# Patient Record
Sex: Female | Born: 1970 | Race: White | Hispanic: No | Marital: Single | Smoking: Former smoker
Health system: Southern US, Community
[De-identification: ages and names within clinical notes are randomized; demographics above are authoritative.]

## PROBLEM LIST (undated history)

## (undated) DIAGNOSIS — M419 Scoliosis, unspecified: Secondary | ICD-10-CM

## (undated) DIAGNOSIS — M810 Age-related osteoporosis without current pathological fracture: Secondary | ICD-10-CM

## (undated) DIAGNOSIS — N200 Calculus of kidney: Secondary | ICD-10-CM

## (undated) DIAGNOSIS — J45909 Unspecified asthma, uncomplicated: Secondary | ICD-10-CM

## (undated) DIAGNOSIS — G43909 Migraine, unspecified, not intractable, without status migrainosus: Secondary | ICD-10-CM

## (undated) DIAGNOSIS — F410 Panic disorder [episodic paroxysmal anxiety] without agoraphobia: Secondary | ICD-10-CM

## (undated) DIAGNOSIS — K219 Gastro-esophageal reflux disease without esophagitis: Secondary | ICD-10-CM

## (undated) DIAGNOSIS — E079 Disorder of thyroid, unspecified: Secondary | ICD-10-CM

## (undated) DIAGNOSIS — K5792 Diverticulitis of intestine, part unspecified, without perforation or abscess without bleeding: Secondary | ICD-10-CM

## (undated) DIAGNOSIS — G43109 Migraine with aura, not intractable, without status migrainosus: Secondary | ICD-10-CM

## (undated) DIAGNOSIS — R569 Unspecified convulsions: Secondary | ICD-10-CM

## (undated) DIAGNOSIS — N159 Renal tubulo-interstitial disease, unspecified: Secondary | ICD-10-CM

## (undated) DIAGNOSIS — J4520 Mild intermittent asthma, uncomplicated: Secondary | ICD-10-CM

## (undated) DIAGNOSIS — J302 Other seasonal allergic rhinitis: Secondary | ICD-10-CM

## (undated) DIAGNOSIS — E039 Hypothyroidism, unspecified: Secondary | ICD-10-CM

## (undated) HISTORY — PX: APPENDECTOMY: SHX54

## (undated) HISTORY — PX: CERVICAL DISC SURGERY: SHX588

## (undated) HISTORY — DX: Unspecified convulsions: R56.9

## (undated) HISTORY — DX: Age-related osteoporosis without current pathological fracture: M81.0

## (undated) HISTORY — DX: Panic disorder (episodic paroxysmal anxiety): F41.0

## (undated) HISTORY — DX: Migraine with aura, not intractable, without status migrainosus: G43.109

## (undated) HISTORY — PX: TONSILLECTOMY: SUR1361

## (undated) HISTORY — DX: Hypothyroidism, unspecified: E03.9

## (undated) HISTORY — DX: Scoliosis, unspecified: M41.9

## (undated) HISTORY — PX: THYROGLOSSAL DUCT CYST: SHX297

## (undated) HISTORY — PX: TUBAL LIGATION: SHX77

## (undated) HISTORY — PX: COLON SURGERY: SHX602

## (undated) HISTORY — PX: KNEE SURGERY: SHX244

---

## 2009-04-05 ENCOUNTER — Emergency Department: Payer: Self-pay | Admitting: Emergency Medicine

## 2009-04-06 ENCOUNTER — Emergency Department: Payer: Self-pay | Admitting: Emergency Medicine

## 2012-08-27 ENCOUNTER — Emergency Department: Payer: Self-pay | Admitting: Internal Medicine

## 2012-08-27 LAB — URINALYSIS, COMPLETE
Bilirubin,UR: NEGATIVE
Glucose,UR: NEGATIVE mg/dL (ref 0–75)
Ketone: NEGATIVE
Protein: NEGATIVE
RBC,UR: 2 /HPF (ref 0–5)
Specific Gravity: 1.008 (ref 1.003–1.030)
Squamous Epithelial: 7
WBC UR: 8 /HPF (ref 0–5)

## 2012-08-27 LAB — COMPREHENSIVE METABOLIC PANEL
Albumin: 3.8 g/dL (ref 3.4–5.0)
Anion Gap: 9 (ref 7–16)
Bilirubin,Total: 0.2 mg/dL (ref 0.2–1.0)
Calcium, Total: 8.8 mg/dL (ref 8.5–10.1)
Chloride: 109 mmol/L — ABNORMAL HIGH (ref 98–107)
EGFR (African American): >60
EGFR (Non-African Amer.): >60
Glucose: 113 mg/dL — ABNORMAL HIGH (ref 65–99)
SGOT(AST): 33 U/L (ref 15–37)
SGPT (ALT): 54 U/L (ref 12–78)
Sodium: 141 mmol/L (ref 136–145)
Total Protein: 7.4 g/dL (ref 6.4–8.2)

## 2012-08-27 LAB — CBC
Platelet: 292 10*3/uL (ref 150–440)
RBC: 4.56 10*6/uL (ref 3.80–5.20)
WBC: 9.8 10*3/uL (ref 3.6–11.0)

## 2012-08-29 LAB — URINE CULTURE

## 2013-01-24 ENCOUNTER — Emergency Department: Payer: Self-pay | Admitting: Emergency Medicine

## 2013-01-24 LAB — URINALYSIS, COMPLETE
Glucose,UR: NEGATIVE mg/dL (ref 0–75)
Ketone: NEGATIVE
Leukocyte Esterase: NEGATIVE
Nitrite: NEGATIVE
Protein: 30
RBC,UR: 19 /HPF (ref 0–5)
Specific Gravity: 1.025 (ref 1.003–1.030)
Squamous Epithelial: 4
WBC UR: 5 /HPF (ref 0–5)

## 2013-01-24 LAB — COMPREHENSIVE METABOLIC PANEL
Albumin: 4.6 g/dL (ref 3.4–5.0)
Alkaline Phosphatase: 81 U/L (ref 50–136)
Anion Gap: 8 (ref 7–16)
BUN: 9 mg/dL (ref 7–18)
Calcium, Total: 9.4 mg/dL (ref 8.5–10.1)
Chloride: 107 mmol/L (ref 98–107)
Co2: 26 mmol/L (ref 21–32)
EGFR (Non-African Amer.): >60
Glucose: 117 mg/dL — ABNORMAL HIGH (ref 65–99)
Potassium: 3.5 mmol/L (ref 3.5–5.1)
SGPT (ALT): 52 U/L (ref 12–78)

## 2013-01-24 LAB — CBC
HCT: 41.1 % (ref 35.0–47.0)
MCH: 29.9 pg (ref 26.0–34.0)
MCHC: 33.9 g/dL (ref 32.0–36.0)
MCV: 88 fL (ref 80–100)
Platelet: 356 10*3/uL (ref 150–440)
WBC: 12.8 10*3/uL — ABNORMAL HIGH (ref 3.6–11.0)

## 2013-12-23 ENCOUNTER — Emergency Department: Payer: Self-pay | Admitting: Emergency Medicine

## 2013-12-24 LAB — COMPREHENSIVE METABOLIC PANEL
ALBUMIN: 4.3 g/dL (ref 3.4–5.0)
ALK PHOS: 65 U/L
AST: 22 U/L (ref 15–37)
Anion Gap: 6 — ABNORMAL LOW (ref 7–16)
BUN: 11 mg/dL (ref 7–18)
Bilirubin,Total: 0.2 mg/dL (ref 0.2–1.0)
CHLORIDE: 106 mmol/L (ref 98–107)
Calcium, Total: 9.3 mg/dL (ref 8.5–10.1)
Co2: 29 mmol/L (ref 21–32)
Creatinine: 0.77 mg/dL (ref 0.60–1.30)
EGFR (Non-African Amer.): >60
Glucose: 91 mg/dL (ref 65–99)
Osmolality: 280 (ref 275–301)
POTASSIUM: 4 mmol/L (ref 3.5–5.1)
SGPT (ALT): 21 U/L (ref 12–78)
Sodium: 141 mmol/L (ref 136–145)
Total Protein: 8.1 g/dL (ref 6.4–8.2)

## 2013-12-24 LAB — CBC WITH DIFFERENTIAL/PLATELET
Basophil #: 0.1 10*3/uL (ref 0.0–0.1)
Basophil %: 0.5 %
EOS ABS: 0.1 10*3/uL (ref 0.0–0.7)
Eosinophil %: 1 %
HCT: 39.4 % (ref 35.0–47.0)
HGB: 12.8 g/dL (ref 12.0–16.0)
Lymphocyte #: 2 10*3/uL (ref 1.0–3.6)
Lymphocyte %: 17.2 %
MCH: 28.3 pg (ref 26.0–34.0)
MCHC: 32.4 g/dL (ref 32.0–36.0)
MCV: 87 fL (ref 80–100)
Monocyte #: 1 x10 3/mm — ABNORMAL HIGH (ref 0.2–0.9)
Monocyte %: 8.3 %
NEUTROS ABS: 8.6 10*3/uL — AB (ref 1.4–6.5)
Neutrophil %: 73 %
Platelet: 264 10*3/uL (ref 150–440)
RBC: 4.52 10*6/uL (ref 3.80–5.20)
RDW: 13.9 % (ref 11.5–14.5)
WBC: 11.8 10*3/uL — ABNORMAL HIGH (ref 3.6–11.0)

## 2013-12-24 LAB — URINALYSIS, COMPLETE
Bilirubin,UR: NEGATIVE
Glucose,UR: NEGATIVE mg/dL (ref 0–75)
KETONE: NEGATIVE
NITRITE: NEGATIVE
Ph: 7 (ref 4.5–8.0)
Protein: NEGATIVE
RBC,UR: 1 /HPF (ref 0–5)
Specific Gravity: 1.002 (ref 1.003–1.030)
WBC UR: 11 /HPF (ref 0–5)

## 2014-01-06 ENCOUNTER — Encounter (HOSPITAL_COMMUNITY): Payer: Self-pay | Admitting: Emergency Medicine

## 2014-01-06 ENCOUNTER — Emergency Department (HOSPITAL_COMMUNITY): Payer: Medicaid Other

## 2014-01-06 ENCOUNTER — Emergency Department (HOSPITAL_COMMUNITY)
Admission: EM | Admit: 2014-01-06 | Discharge: 2014-01-06 | Disposition: A | Discharge status: Home / Self Care | Payer: Medicaid Other | Attending: Emergency Medicine | Admitting: Emergency Medicine

## 2014-01-06 DIAGNOSIS — Z8719 Personal history of other diseases of the digestive system: Secondary | ICD-10-CM | POA: Insufficient documentation

## 2014-01-06 DIAGNOSIS — E079 Disorder of thyroid, unspecified: Secondary | ICD-10-CM | POA: Insufficient documentation

## 2014-01-06 DIAGNOSIS — Z8619 Personal history of other infectious and parasitic diseases: Secondary | ICD-10-CM | POA: Insufficient documentation

## 2014-01-06 DIAGNOSIS — M545 Low back pain, unspecified: Secondary | ICD-10-CM | POA: Insufficient documentation

## 2014-01-06 DIAGNOSIS — Z3202 Encounter for pregnancy test, result negative: Secondary | ICD-10-CM | POA: Insufficient documentation

## 2014-01-06 DIAGNOSIS — R109 Unspecified abdominal pain: Secondary | ICD-10-CM | POA: Insufficient documentation

## 2014-01-06 DIAGNOSIS — G43909 Migraine, unspecified, not intractable, without status migrainosus: Secondary | ICD-10-CM | POA: Insufficient documentation

## 2014-01-06 DIAGNOSIS — Z79899 Other long term (current) drug therapy: Secondary | ICD-10-CM | POA: Insufficient documentation

## 2014-01-06 DIAGNOSIS — Z87442 Personal history of urinary calculi: Secondary | ICD-10-CM | POA: Insufficient documentation

## 2014-01-06 DIAGNOSIS — J45909 Unspecified asthma, uncomplicated: Secondary | ICD-10-CM | POA: Insufficient documentation

## 2014-01-06 HISTORY — DX: Unspecified asthma, uncomplicated: J45.909

## 2014-01-06 HISTORY — DX: Disorder of thyroid, unspecified: E07.9

## 2014-01-06 HISTORY — DX: Diverticulitis of intestine, part unspecified, without perforation or abscess without bleeding: K57.92

## 2014-01-06 HISTORY — DX: Calculus of kidney: N20.0

## 2014-01-06 HISTORY — DX: Renal tubulo-interstitial disease, unspecified: N15.9

## 2014-01-06 HISTORY — DX: Migraine, unspecified, not intractable, without status migrainosus: G43.909

## 2014-01-06 LAB — URINALYSIS, ROUTINE W REFLEX MICROSCOPIC
Bilirubin Urine: NEGATIVE
GLUCOSE, UA: NEGATIVE mg/dL
Ketones, ur: NEGATIVE mg/dL
Nitrite: NEGATIVE
Protein, ur: NEGATIVE mg/dL
SPECIFIC GRAVITY, URINE: 1.006 (ref 1.005–1.030)
UROBILINOGEN UA: 0.2 mg/dL (ref 0.0–1.0)
pH: 6 (ref 5.0–8.0)

## 2014-01-06 LAB — CBC WITH DIFFERENTIAL/PLATELET
BASOS ABS: 0 10*3/uL (ref 0.0–0.1)
Basophils Relative: 0 % (ref 0–1)
EOS ABS: 0.1 10*3/uL (ref 0.0–0.7)
EOS PCT: 1 % (ref 0–5)
HCT: 38.3 % (ref 36.0–46.0)
Hemoglobin: 12.9 g/dL (ref 12.0–15.0)
Lymphocytes Relative: 30 % (ref 12–46)
Lymphs Abs: 1.8 10*3/uL (ref 0.7–4.0)
MCH: 29.3 pg (ref 26.0–34.0)
MCHC: 33.7 g/dL (ref 30.0–36.0)
MCV: 86.8 fL (ref 78.0–100.0)
Monocytes Absolute: 0.7 10*3/uL (ref 0.1–1.0)
Monocytes Relative: 11 % (ref 3–12)
Neutro Abs: 3.5 10*3/uL (ref 1.7–7.7)
Neutrophils Relative %: 58 % (ref 43–77)
Platelets: 265 10*3/uL (ref 150–400)
RBC: 4.41 MIL/uL (ref 3.87–5.11)
RDW: 13.5 % (ref 11.5–15.5)
WBC: 6 10*3/uL (ref 4.0–10.5)

## 2014-01-06 LAB — COMPREHENSIVE METABOLIC PANEL
ALBUMIN: 4.3 g/dL (ref 3.5–5.2)
ALT: 16 U/L (ref 0–35)
AST: 20 U/L (ref 0–37)
Alkaline Phosphatase: 50 U/L (ref 39–117)
BUN: 9 mg/dL (ref 6–23)
CALCIUM: 9.6 mg/dL (ref 8.4–10.5)
CO2: 26 mEq/L (ref 19–32)
CREATININE: 0.72 mg/dL (ref 0.50–1.10)
Chloride: 101 mEq/L (ref 96–112)
GFR calc Af Amer: >90 mL/min (ref >90)
Glucose, Bld: 72 mg/dL (ref 70–99)
Potassium: 3.7 mEq/L (ref 3.7–5.3)
Sodium: 139 mEq/L (ref 137–147)
Total Bilirubin: <0.2 mg/dL — ABNORMAL LOW (ref 0.3–1.2)
Total Protein: 7.4 g/dL (ref 6.0–8.3)

## 2014-01-06 LAB — URINE MICROSCOPIC-ADD ON

## 2014-01-06 LAB — PREGNANCY, URINE: Preg Test, Ur: NEGATIVE

## 2014-01-06 MED ORDER — NAPROXEN 500 MG PO TABS: 500.0000 mg | ORAL_TABLET | Freq: Two times a day (BID) | ORAL | Status: DC

## 2014-01-06 MED ORDER — CYCLOBENZAPRINE HCL 10 MG PO TABS: 10.0000 mg | ORAL_TABLET | Freq: Two times a day (BID) | ORAL | Status: DC | PRN

## 2014-01-06 MED ORDER — KETOROLAC TROMETHAMINE 30 MG/ML IJ SOLN
30.0000 mg | Freq: Once | INTRAMUSCULAR | Status: AC
  Administered 2014-01-06: 30 mg via INTRAVENOUS
  Filled 2014-01-06: qty 1

## 2014-01-06 MED ORDER — CEPHALEXIN 250 MG PO CAPS
250.0000 mg | ORAL_CAPSULE | Freq: Four times a day (QID) | ORAL | Status: DC
Start: 2014-01-06 — End: 2015-05-23

## 2014-01-06 NOTE — Discharge Instructions (Signed)
Your CT scan showed small kidney stones inside your kidneys. Also there is evidence of a urinary tract infection. Please take keflex as prescribed until all gone for the infection. Take naprosyn for pain. Flexeril for muscle spasms. Follow up with your doctor.   Flank Pain Flank pain refers to pain that is located on the side of the body between the upper abdomen and the back. The pain may occur over a short period of time (acute) or may be long-term or reoccurring (chronic). It may be mild or severe. Flank pain can be caused by many things. CAUSES  Some of the more common causes of flank pain include:  Muscle strains.   Muscle spasms.   A disease of your spine (vertebral disk disease).   A lung infection (pneumonia).   Fluid around your lungs (pulmonary edema).   A kidney infection.   Kidney stones.   A very painful skin rash caused by the chickenpox virus (shingles).   Gallbladder disease.  HOME CARE INSTRUCTIONS  Home care will depend on the cause of your pain. In general,  Rest as directed by your caregiver.  Drink enough fluids to keep your urine clear or pale yellow.  Only take over-the-counter or prescription medicines as directed by your caregiver. Some medicines may help relieve the pain.  Tell your caregiver about any changes in your pain.  Follow up with your caregiver as directed. SEEK IMMEDIATE MEDICAL CARE IF:   Your pain is not controlled with medicine.   You have new or worsening symptoms.  Your pain increases.   You have abdominal pain.   You have shortness of breath.   You have persistent nausea or vomiting.   You have swelling in your abdomen.   You feel faint or pass out.   You have blood in your urine.  You have a fever or persistent symptoms for more than 2 3 days.  You have a fever and your symptoms suddenly get worse. MAKE SURE YOU:   Understand these instructions.  Will watch your condition.  Will get help right  away if you are not doing well or get worse. Document Released: 10/24/2005 Document Revised: 05/27/2012 Document Reviewed: 04/16/2012 [facility] Patient Information 2014 [place], [place].

## 2014-01-06 NOTE — ED Provider Notes (Signed)
CSN: [id number]     Arrival date & time 01/06/14  1427 History   First MD Initiated Contact with Patient 01/06/14 1616     Chief Complaint  Patient presents with  . Nephrolithiasis    stated that ultrasound today should a kidney stone     (Consider location/radiation/quality/duration/timing/severity/associated sxs/prior Treatment) HPI Cassandra Zuniga is a 43 y.o. female who presents to emergency department complaining of right flank pain for 2 weeks. Patient states she was initially seen at [facility] for this pain, was diagnosed with a kidney infection, was given a course of antibiotics which she has finished. She states her pain continued, so she went to a [facility] and was diagnosed with a 10 mm stone in the left kidney by ultrasound. She states she has not had any pain in the left flank. States her pain is mainly in the right mid back and wraps around it radiates into the right groin. She denies any urinary symptoms. She denies any vaginal discharge or bleeding. She does not believe she's pregnant. She admits to nausea and vomiting. No changes in bowels. States movement does not make her pain worse. States she is most uncomfortable at nighttime.   Past Medical History  Diagnosis Date  . Kidney stones   . Kidney infection   . Thyroid disease   . Diverticulitis   . Asthma   . Migraine    Past Surgical History  Procedure Laterality Date  . Tonsillectomy    . Thyroglossal duct cyst    . Cervical disc surgery    . Appendectomy    . Tubal ligation    . Knee surgery      bilateral   Family History  Problem Relation Age of Onset  . Diabetes Other   . Cancer Other    History  Substance Use Topics  . Smoking status: Never Smoker   . Smokeless tobacco: Not on file  . Alcohol Use: No   OB History   Grav Para Term Preterm Abortions TAB SAB Ect Mult Living                 Review of Systems  Constitutional: Negative for fever and chills.  Respiratory:  Negative for cough, chest tightness and shortness of breath.   Cardiovascular: Negative for chest pain, palpitations and leg swelling.  Gastrointestinal: Negative for nausea, vomiting, abdominal pain and diarrhea.  Genitourinary: Positive for flank pain. Negative for dysuria, frequency, vaginal bleeding, vaginal discharge, difficulty urinating, vaginal pain and pelvic pain.  Musculoskeletal: Positive for back pain. Negative for arthralgias, myalgias, neck pain and neck stiffness.  Skin: Negative for rash.  Neurological: Negative for dizziness, weakness, numbness and headaches.  All other systems reviewed and are negative.     Allergies  Erythromycin; Sulfa antibiotics; and Phenergan  Home Medications   Prior to Admission medications   Medication Sig Start Date End Date Taking? Authorizing Provider  butalbital-acetaminophen-caffeine (FIORICET, ESGIC) 50-325-40 MG per tablet Take 1-2 tablets by mouth every 4 (four) hours as needed for headache.   Yes Historical Provider, MD  famotidine (PEPCID) 20 MG tablet Take 20 mg by mouth 2 (two) times daily.   Yes Historical Provider, MD  ibuprofen (ADVIL,MOTRIN) 100 MG tablet Take 100 mg by mouth every 6 (six) hours as needed for pain or fever.   Yes Historical Provider, MD  levothyroxine (SYNTHROID, LEVOTHROID) 100 MCG tablet Take 100 mcg by mouth daily before breakfast.   Yes Historical Provider, MD  Multiple Vitamin (MULTIVITAMIN  WITH MINERALS) TABS tablet Take 1 tablet by mouth daily.   Yes Historical Provider, MD  oxyCODONE-acetaminophen (PERCOCET/ROXICET) 5-325 MG per tablet Take 1 tablet by mouth every 4 (four) hours as needed for severe pain.   Yes Historical Provider, MD  rizatriptan (MAXALT-MLT) 10 MG disintegrating tablet Take 10 mg by mouth as needed for migraine. May repeat in 2 hours if needed   Yes Historical Provider, MD   BP 110/57  Pulse 77  Temp(Src) 98 F (36.7 C) (Oral)  Resp 18  Wt 134 lb (60.782 kg)  SpO2 98%  LMP  12/20/2013 Physical Exam  Nursing note and vitals reviewed. Constitutional: She is oriented to person, place, and time. She appears well-developed and well-nourished. No distress.  HENT:  Head: Normocephalic.  Eyes: Conjunctivae are normal.  Neck: Neck supple.  Cardiovascular: Normal rate, regular rhythm and normal heart sounds.   Pulmonary/Chest: Effort normal and breath sounds normal. No respiratory distress. She has no wheezes. She has no rales.  Abdominal: Soft. Bowel sounds are normal. She exhibits no distension. There is no tenderness. There is no rebound.  Right CVA tenderness.  Musculoskeletal: She exhibits no edema.  Tender to palpation in right lower back. No midline tenderness.  Pain with right straight leg raise  Neurological: She is alert and oriented to person, place, and time.  5/5 and equal lower extremity strength. 2+ and equal patellar reflexes bilaterally. Pt able to dorsiflex bilateral toes and feet with good strength against resistance. Equal sensation bilaterally over thighs and lower legs.   Skin: Skin is warm and dry.  Psychiatric: She has a normal mood and affect. Her behavior is normal.    ED Course  Procedures (including critical care time) Labs Review Labs Reviewed  COMPREHENSIVE METABOLIC PANEL - Abnormal; Notable for the following:    Total Bilirubin <0.2 (*)    All other components within normal limits  URINALYSIS, ROUTINE W REFLEX MICROSCOPIC - Abnormal; Notable for the following:    Hgb urine dipstick TRACE (*)    Leukocytes, UA MODERATE (*)    All other components within normal limits  CBC WITH DIFFERENTIAL  URINE MICROSCOPIC-ADD ON  PREGNANCY, URINE    Imaging Review Ct Abdomen Pelvis Wo Contrast  01/06/2014   CLINICAL DATA:  Right flank pain for 2 weeks.  EXAM: CT ABDOMEN AND PELVIS WITHOUT CONTRAST  TECHNIQUE: Multidetector CT imaging of the abdomen and pelvis was performed following the standard protocol without intravenous contrast.   COMPARISON:  None.  FINDINGS: 0.8 x 0.7 cm subtle hypodense lesion in segment 4 of the liver, image 18 series 2.  Spleen, pancreas, and adrenal glands unremarkable. No specific gallbladder or biliary abnormality identified.  There are several 1-2 mm nonobstructive renal calculi in the right kidney lower pole. 3 mm left mid kidney calculus. No overt hydronephrosis. No definite ureteral calculus.  Appendix surgically absent.  Uterine and adnexal contours unremarkable. No free pelvic fluid. No dilated bowel noted. Urinary bladder unremarkable.  IMPRESSION: 1. Bilateral nonobstructive nephrolithiasis. 2. 8 mm hypodense lesion in segment 4 of the liver, technically nonspecific although statistically likely to be benign.   Electronically Signed   By: Herbie BaltimoreWalt  Liebkemann M.D.   On: 01/06/2014 17:00     EKG Interpretation None      MDM   Final diagnoses:  Flank pain    Patient with right flank pain, history of kidney stones. Recently treated with Cipro for a kidney infection. Pain is reproducible with palpation and movement. Neurovascularly intact. Will  check urine, labs, CT abdomen pelvis.   5:34 PM Toradol given for pain, states feels better. CT showing bilateral nonobstructive stones. Incidental finding of a lesion in the liver, discussed with patient, we'll need close followup. Urine does show moderate leukocytes with 11-20 white blood cells. Will start on Keflex. I do think her pain may be musculoskeletal. Will continue naprosyn, she has percocet at home, will add keflex. Follow up with PCP. Pt agreed to the plan. She is afebrile, no signs of cauda equina, no evidence of acute abdomen or sepsis. Stable for d/c home.   Filed Vitals:   01/06/14 1514  BP: 110/57  Pulse: 77  Temp: 98 F (36.7 C)  TempSrc: Oral  Resp: 18  Weight: 134 lb (60.782 kg)  SpO2: 98%       Giani Betzold A Marvyn Torrez, PA-C 01/06/14 1736

## 2014-01-06 NOTE — ED Notes (Addendum)
Pt reports a that an ultrasound was performed by PCP today. "[facility] noted a 10 mm stone was in the l/kidney." Pt c/o severe r/flank pain for 2 weeks, she completed medication for UTI and kidney infection. Hx of kidney stone 2 years ago.  Denies vomiting, occasional nausea

## 2014-01-07 NOTE — ED Provider Notes (Signed)
Medical screening examination/treatment/procedure(s) were performed by non-physician practitioner and as supervising physician I was immediately available for consultation/collaboration.   EKG Interpretation None        Suzi RootsKevin E Ivey Nembhard, MD 01/07/14 1158

## 2014-04-23 ENCOUNTER — Encounter (HOSPITAL_COMMUNITY): Payer: Self-pay | Admitting: Emergency Medicine

## 2014-04-23 ENCOUNTER — Emergency Department (HOSPITAL_COMMUNITY): Payer: Medicaid Other

## 2014-04-23 ENCOUNTER — Observation Stay (HOSPITAL_COMMUNITY)
Admission: EM | Admit: 2014-04-23 | Discharge: 2014-04-24 | Disposition: A | Discharge status: Home / Self Care | Payer: Medicaid Other | Attending: Family Medicine | Admitting: Family Medicine

## 2014-04-23 DIAGNOSIS — Z79899 Other long term (current) drug therapy: Secondary | ICD-10-CM | POA: Diagnosis not present

## 2014-04-23 DIAGNOSIS — E039 Hypothyroidism, unspecified: Secondary | ICD-10-CM | POA: Diagnosis present

## 2014-04-23 DIAGNOSIS — I498 Other specified cardiac arrhythmias: Secondary | ICD-10-CM | POA: Diagnosis not present

## 2014-04-23 DIAGNOSIS — R001 Bradycardia, unspecified: Secondary | ICD-10-CM | POA: Diagnosis present

## 2014-04-23 DIAGNOSIS — R079 Chest pain, unspecified: Secondary | ICD-10-CM | POA: Diagnosis present

## 2014-04-23 DIAGNOSIS — Z87891 Personal history of nicotine dependence: Secondary | ICD-10-CM | POA: Insufficient documentation

## 2014-04-23 DIAGNOSIS — K219 Gastro-esophageal reflux disease without esophagitis: Secondary | ICD-10-CM | POA: Diagnosis not present

## 2014-04-23 DIAGNOSIS — J4520 Mild intermittent asthma, uncomplicated: Secondary | ICD-10-CM | POA: Diagnosis present

## 2014-04-23 DIAGNOSIS — R0789 Other chest pain: Principal | ICD-10-CM | POA: Insufficient documentation

## 2014-04-23 DIAGNOSIS — G43909 Migraine, unspecified, not intractable, without status migrainosus: Secondary | ICD-10-CM | POA: Insufficient documentation

## 2014-04-23 DIAGNOSIS — J45909 Unspecified asthma, uncomplicated: Secondary | ICD-10-CM | POA: Insufficient documentation

## 2014-04-23 HISTORY — DX: Gastro-esophageal reflux disease without esophagitis: K21.9

## 2014-04-23 HISTORY — DX: Mild intermittent asthma, uncomplicated: J45.20

## 2014-04-23 HISTORY — DX: Other seasonal allergic rhinitis: J30.2

## 2014-04-23 LAB — CBC
HCT: 43.1 % (ref 36.0–46.0)
HEMOGLOBIN: 14.3 g/dL (ref 12.0–15.0)
MCH: 29.1 pg (ref 26.0–34.0)
MCHC: 33.2 g/dL (ref 30.0–36.0)
MCV: 87.6 fL (ref 78.0–100.0)
PLATELETS: 233 10*3/uL (ref 150–400)
RBC: 4.92 MIL/uL (ref 3.87–5.11)
RDW: 13.1 % (ref 11.5–15.5)
WBC: 8 10*3/uL (ref 4.0–10.5)

## 2014-04-23 LAB — BASIC METABOLIC PANEL
Anion gap: 16 — ABNORMAL HIGH (ref 5–15)
BUN: 12 mg/dL (ref 6–23)
CALCIUM: 9.7 mg/dL (ref 8.4–10.5)
CO2: 19 mEq/L (ref 19–32)
Chloride: 104 mEq/L (ref 96–112)
Creatinine, Ser: 0.64 mg/dL (ref 0.50–1.10)
GFR calc Af Amer: >90 mL/min (ref >90)
GLUCOSE: 83 mg/dL (ref 70–99)
Potassium: 5.1 mEq/L (ref 3.7–5.3)
SODIUM: 139 meq/L (ref 137–147)

## 2014-04-23 LAB — I-STAT TROPONIN, ED: TROPONIN I, POC: 0 ng/mL (ref 0.00–0.08)

## 2014-04-23 LAB — TROPONIN I: Troponin I: <0.3 ng/mL (ref <0.30)

## 2014-04-23 MED ORDER — DIPHENHYDRAMINE HCL 50 MG/ML IJ SOLN: 25.0000 mg | Freq: Once | INTRAMUSCULAR | Status: DC

## 2014-04-23 MED ORDER — ALBUTEROL SULFATE (2.5 MG/3ML) 0.083% IN NEBU: 2.5000 mg | INHALATION_SOLUTION | Freq: Four times a day (QID) | RESPIRATORY_TRACT | Status: DC | PRN

## 2014-04-23 MED ORDER — FAMOTIDINE 20 MG PO TABS
20.0000 mg | ORAL_TABLET | Freq: Two times a day (BID) | ORAL | Status: DC
  Administered 2014-04-23 – 2014-04-24 (×2): 20 mg via ORAL
  Filled 2014-04-23 (×3): qty 1

## 2014-04-23 MED ORDER — SODIUM CHLORIDE 0.9 % IJ SOLN
3.0000 mL | Freq: Two times a day (BID) | INTRAMUSCULAR | Status: DC
  Administered 2014-04-23 – 2014-04-24 (×2): 3 mL via INTRAVENOUS

## 2014-04-23 MED ORDER — DIPHENHYDRAMINE HCL 50 MG/ML IJ SOLN: 12.5000 mg | Freq: Four times a day (QID) | INTRAMUSCULAR | Status: DC | PRN

## 2014-04-23 MED ORDER — RIZATRIPTAN BENZOATE 10 MG PO TBDP: 10.0000 mg | ORAL_TABLET | ORAL | Status: DC | PRN

## 2014-04-23 MED ORDER — MORPHINE SULFATE 2 MG/ML IJ SOLN
1.0000 mg | INTRAMUSCULAR | Status: DC | PRN
  Administered 2014-04-24: 1 mg via INTRAVENOUS
  Filled 2014-04-23: qty 1

## 2014-04-23 MED ORDER — MORPHINE SULFATE 4 MG/ML IJ SOLN
4.0000 mg | Freq: Once | INTRAMUSCULAR | Status: AC
  Administered 2014-04-23: 4 mg via INTRAVENOUS
  Filled 2014-04-23: qty 1

## 2014-04-23 MED ORDER — ALBUTEROL SULFATE HFA 108 (90 BASE) MCG/ACT IN AERS: 2.0000 | INHALATION_SPRAY | Freq: Four times a day (QID) | RESPIRATORY_TRACT | Status: DC | PRN

## 2014-04-23 MED ORDER — ENOXAPARIN SODIUM 40 MG/0.4ML ~~LOC~~ SOLN
40.0000 mg | SUBCUTANEOUS | Status: DC
  Administered 2014-04-23: 40 mg via SUBCUTANEOUS
  Filled 2014-04-23 (×2): qty 0.4

## 2014-04-23 MED ORDER — SODIUM CHLORIDE 0.9 % IV SOLN: 250.0000 mL | INTRAVENOUS | Status: DC | PRN

## 2014-04-23 MED ORDER — FLUTICASONE PROPIONATE 50 MCG/ACT NA SUSP
1.0000 | Freq: Every day | NASAL | Status: DC
  Administered 2014-04-23 – 2014-04-24 (×2): 1 via NASAL
  Filled 2014-04-23: qty 16

## 2014-04-23 MED ORDER — ONDANSETRON HCL 4 MG/2ML IJ SOLN
4.0000 mg | Freq: Once | INTRAMUSCULAR | Status: AC
  Administered 2014-04-23: 4 mg via INTRAVENOUS
  Filled 2014-04-23: qty 2

## 2014-04-23 MED ORDER — DIPHENHYDRAMINE HCL 25 MG PO CAPS
25.0000 mg | ORAL_CAPSULE | Freq: Four times a day (QID) | ORAL | Status: DC | PRN
  Administered 2014-04-24: 25 mg via ORAL
  Filled 2014-04-23 (×2): qty 1

## 2014-04-23 MED ORDER — LEVOTHYROXINE SODIUM 100 MCG PO TABS
100.0000 ug | ORAL_TABLET | Freq: Every day | ORAL | Status: DC
  Administered 2014-04-24: 100 ug via ORAL
  Filled 2014-04-23 (×2): qty 1

## 2014-04-23 MED ORDER — SODIUM CHLORIDE 0.9 % IJ SOLN: 3.0000 mL | INTRAMUSCULAR | Status: DC | PRN

## 2014-04-23 MED ORDER — GI COCKTAIL ~~LOC~~
30.0000 mL | Freq: Once | ORAL | Status: AC
  Administered 2014-04-23: 30 mL via ORAL
  Filled 2014-04-23: qty 30

## 2014-04-23 MED ORDER — ACETAMINOPHEN 650 MG RE SUPP: 650.0000 mg | Freq: Four times a day (QID) | RECTAL | Status: DC | PRN

## 2014-04-23 MED ORDER — ASPIRIN EC 81 MG PO TBEC
81.0000 mg | DELAYED_RELEASE_TABLET | Freq: Every day | ORAL | Status: DC
  Administered 2014-04-23 – 2014-04-24 (×2): 81 mg via ORAL
  Filled 2014-04-23 (×2): qty 1

## 2014-04-23 MED ORDER — SODIUM CHLORIDE 0.9 % IJ SOLN: 3.0000 mL | Freq: Two times a day (BID) | INTRAMUSCULAR | Status: DC

## 2014-04-23 MED ORDER — NITROGLYCERIN 0.4 MG SL SUBL: 0.4000 mg | SUBLINGUAL_TABLET | SUBLINGUAL | Status: DC | PRN

## 2014-04-23 MED ORDER — ACETAMINOPHEN 325 MG PO TABS: 650.0000 mg | ORAL_TABLET | Freq: Four times a day (QID) | ORAL | Status: DC | PRN

## 2014-04-23 MED ORDER — BUTALBITAL-APAP-CAFFEINE 50-325-40 MG PO TABS: 1.0000 | ORAL_TABLET | Freq: Two times a day (BID) | ORAL | Status: DC | PRN

## 2014-04-23 MED ORDER — PROMETHAZINE HCL 25 MG PO TABS: 12.5000 mg | ORAL_TABLET | Freq: Four times a day (QID) | ORAL | Status: DC | PRN

## 2014-04-23 NOTE — ED Provider Notes (Signed)
CSN: [id number]     Arrival date & time 04/23/14  1214 History   First MD Initiated Contact with Patient 04/23/14 1220     Chief Complaint  Patient presents with  . Chest Pain     (Consider location/radiation/quality/duration/timing/severity/associated sxs/prior Treatment) HPI   The patient presents to the emergency department with complaints of chest pain and pressure. She reports waking up feeling fine and while she was cooking breakfast she developed some chest pressure. Then when eating breakfast it became more severe. While out working in the garden the pressure worsened and turned into pain and she started to have some numbness down her bilateral arms. She got up to go sit under a tree to rest and felt as though she might have syncopal episode and felt dizzy. It is at this time she decided to come into the emergency department for further evaluation. Since being here the pain continues, she is anxious and tearful. She has a past medical history positive for thyroid disease, migraines, acid reflux.  She does not have hypertension, hyperlipidemia or significant family history of cardiac disease. Non smoker. In triage her vital signs are stable and her EKG is unremarkable.   Past Medical History  Diagnosis Date  . Thyroid disease    Past Surgical History  Procedure Laterality Date  . Colon surgery    . Appendectomy     No family history on file. History  Substance Use Topics  . Smoking status: Never Smoker   . Smokeless tobacco: Not on file  . Alcohol Use: No   OB History   Grav Para Term Preterm Abortions TAB SAB Ect Mult Living                 Review of Systems   Review of Systems  Gen: no weight loss, fevers, chills, night sweats  Eyes: no discharge or drainage, no occular pain or visual changes  Nose: no epistaxis or rhinorrhea  Mouth: no dental pain, no sore throat  Neck: no neck pain  Lungs:No wheezing or hemoptysis No coughing CV:  No palpitations, dependent  edema or orthopnea. + chest pain  And pressure Abd: no diarrhea. No nausea or vomiting, No abdominal pain  GU: no dysuria or gross hematuria  MSK:  No muscle weakness, No  pain Neuro: no headache, no focal neurologic deficits  Skin: no rash , no wounds Psyche: no complaints    Allergies  Erythromycin; Percocet; Phenergan; and Sulfa antibiotics  Home Medications   Prior to Admission medications   Medication Sig Start Date End Date Taking? Authorizing Provider  albuterol (PROVENTIL HFA;VENTOLIN HFA) 108 (90 BASE) MCG/ACT inhaler Inhale 2 puffs into the lungs every 6 (six) hours as needed for wheezing or shortness of breath.   Yes Historical Provider, MD  butalbital-acetaminophen-caffeine (FIORICET, ESGIC) 50-325-40 MG per tablet Take 1 tablet by mouth 2 (two) times daily as needed for headache or migraine.   Yes Historical Provider, MD  famotidine (PEPCID) 20 MG tablet Take 20 mg by mouth 2 (two) times daily.   Yes Historical Provider, MD  fluticasone (FLONASE) 50 MCG/ACT nasal spray Place 1 spray into both nostrils daily.   Yes Historical Provider, MD  levothyroxine (SYNTHROID, LEVOTHROID) 100 MCG tablet Take 100 mcg by mouth daily before breakfast.   Yes Historical Provider, MD  rizatriptan (MAXALT-MLT) 10 MG disintegrating tablet Take 10 mg by mouth every 2 (two) hours as needed for migraine. May repeat in 2 hours if needed   Yes Historical Provider,  MD  promethazine (PHENERGAN) 12.5 MG tablet Take 12.5 mg by mouth every 6 (six) hours as needed for nausea or vomiting.    Historical Provider, MD   BP 128/90  Pulse 67  Resp 16  SpO2 99%  LMP 04/11/2014 Physical Exam  Nursing note and vitals reviewed. Constitutional: She appears well-developed and well-nourished. No distress.  HENT:  Head: Normocephalic and atraumatic.  Eyes: Pupils are equal, round, and reactive to light.  Neck: Normal range of motion. Neck supple.  Cardiovascular: Normal rate and regular rhythm.    Pulmonary/Chest: Effort normal and breath sounds normal. She exhibits no tenderness, no laceration and no retraction.  Abdominal: Soft.  Neurological: She is alert.  Skin: Skin is warm and dry.  Psychiatric: Her mood appears not anxious.     ED Course  Procedures (including critical care time) Labs Review Labs Reviewed  BASIC METABOLIC PANEL - Abnormal; Notable for the following:    Anion gap 16 (*)    All other components within normal limits  CBC  I-STAT TROPOININ, ED    Imaging Review Dg Chest 2 View  04/23/2014   CLINICAL DATA:  43 year old female with chest pain. Former smoker. Initial encounter.  EXAM: CHEST  2 VIEW  COMPARISON:  None.  FINDINGS: Lung volumes are within normal limits. Normal cardiac size and mediastinal contours. Visualized tracheal air column is within normal limits. Cervical ACDF hardware incidentally noted. No pneumothorax or pulmonary edema. No pleural effusion or consolidation. No pulmonary nodule or confluent pulmonary opacity identified. No acute osseous abnormality identified. 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  IMPRESSION: No acute cardiopulmonary abnormality.   Electronically Signed   By: Augusto GambleLee  Hall M.D.   On: 04/23/2014 14:29     EKG Interpretation   Date/Time:  Saturday April 23 2014 12:25:10 EDT Ventricular Rate:  75 PR Interval:  155 QRS Duration: 95 QT Interval:  376 QTC Calculation: 420 R Axis:   81 Text Interpretation:  Sinus rhythm Confirmed by Gwendolyn GrantWALDEN  MD, BLAIR ([id number])  on 04/23/2014 12:29:46 PM      MDM   Final diagnoses:  Chest pain, unspecified chest pain type   2:02 pm Heart Score of 2  The patient does not have any significant risk factors however her story is concerning for ACS. She is given a GI cocktail with no improvement or symptoms. We'll give a dose of 4 mg IV morphine and Zofran to attempts to  control the pain.   Patient had a local reaction at IV site to the Morphine, given IV Benadryl 25mg . Dr. Gwendolyn GrantWalden has seen patient and recommends SL nitro tabs  3:20 pm Cardiology has been paged 3 times with no return call in an hour and 15 minutes. The patient still has some pressure but no more pain.  3:51 pm The patient has been admitted to triad, [facility], Team 8, inpatient, telemetry  Dorthula Matasiffany G Reiley Bertagnolli, PA-C 04/23/14 1552

## 2014-04-23 NOTE — ED Notes (Signed)
Patient states that she woke up this am with pressure on her chest. The patient has been active in the garden today and states that this makes the pressure worse. The patient also reports that she has numbness to her bilateral arms. Ptient is tearful and anxious here.

## 2014-04-23 NOTE — ED Notes (Signed)
Attempted to call report nurse to call back.

## 2014-04-23 NOTE — H&P (Signed)
[facility] History and Physical  Cassandra Zuniga ZOX:[id number] DOB: Oct 13, 1970 DOA: 04/23/2014  Referring physician: ED physician PCP: No primary provider on file.   Chief Complaint: chest pain  HPI:  Cassandra Zuniga is a 43yo woman with PMH of Migraine, hypothyroidism, GERD, asthma who presents with acute onset of chest pain.  Cassandra Zuniga notes that she awoke in her normal state of health, but while starting to make breakfast she developed substernal chest pressure.  She was not sure what was causing the symptoms and she made breakfast and ate without issue, the pain persisted.  After this, she went out into the yard with her Cassandra Zuniga to do yard work and as she was working in the yard the pain intensified and became associated with bilateral arm numbness and tingling, along with weakness in her arms.  She rested in the shade and the arm issues improved, however, when the pain did not go away she decided to come to the ED.  She had associated diaphoresis and dizziness but denies N/V and SOB; palpitations, leg swelling or anxiety.  She reports the pain was not reproducible or changed with taking a breath.  The pain has been relatively constant and severe.  She did not notice any specific aggravating factors.  She had some improvement with the pain after morphine and nitro in the ED.  She did not have any improvement with a GI cocktail.  She has a Cassandra Zuniga with angina and her Cassandra Zuniga has atypical chest pain that is not felt to be cardiac in nature.  She has never had pain like this before.  She has had a recent increase in her synthroid dose which has caused some sweating and nausea.  She has also had recent sinus and urinary infections for which she was treated with Augmentin and Ciprofloxacin.   Assessment and Plan:  Chest pain, some typical and atypical features - Admit for observation - TnI X 2 more times, first negative - EKG without changes, repeat for any chest pain, repeat in AM -  Unclear etiology at this time as pain is constant and only mildly better with medication therapy in the ED, will likely need further evaluation as an outpatient, ? If stress can be helpful - Nitro/morphine as needed for pain  Bradycardia - Patient with HR in the 50s to low 60s on telemetry, not on any blood pressure medications - Monitor on telemetry - Check TSH - Can occur in acute MI, work up as above  Migraine - Continue home medications, currently no pain  Hypothyroidism - Recent increase in therapy, but only took this morning - Check TSH    Intermittent asthma - albuterol as needed   Radiological Exams on Admission: Dg Chest 2 View  04/23/2014   CLINICAL DATA:  43 year old female with chest pain. Former smoker. Initial encounter.  EXAM: CHEST  2 VIEW  COMPARISON:  None.  FINDINGS: Lung volumes are within normal limits. Normal cardiac size and mediastinal contours. Visualized tracheal air column is within normal limits. Cervical ACDF hardware incidentally noted. No pneumothorax or pulmonary edema. No pleural effusion or consolidation. No pulmonary nodule or confluent pulmonary opacity identified. No acute osseous abnormality identified. 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  IMPRESSION: No acute cardiopulmonary abnormality.   Electronically Signed   By: Augusto Gamble M.D.   On: 04/23/2014 14:29     Code Status: Full Cassandra Zuniga  Communication: Pt at bedside Disposition Plan: Admit for observation for chest pain     Review of Systems:  Constitutional: Negative for fever, chills and malaise/fatigue. Positive for diaphoresis.  HENT: Negative for hearing loss, ear pain, nosebleeds, congestion, sore throat, neck pain, tinnitus and ear discharge.   Eyes: Negative for blurred vision, discharge and redness.  Respiratory: Negative for cough, hemoptysis, sputum production,  shortness of breath, wheezing and stridor.   Cardiovascular: Positive for chest pain.  Negative for palpitations, orthopnea, claudication and leg swelling.  Gastrointestinal: Negative for nausea, vomiting and abdominal pain. Positive for heartburn, takes famotidine.  Negative for constipation, blood in stool and melena.  Genitourinary: Negative for dysuria, urgency, frequency, hematuria and flank pain.  Musculoskeletal: Negative for myalgias, back pain, joint pain and falls.  Had one episode of back pain in the ED, has improved today Skin: Positive for itching after morphine and zofran dose.  Negative for rash.  Neurological: Positive for dizziness and weakness. Positive for tingling, sensory change.  Negative for speech change, focal weakness, loss of consciousness and headaches.  Endo/Heme/Allergies: Negative for environmental allergies and polydipsia. Does not bruise/bleed easily.  Psychiatric/Behavioral: The patient is not nervous/anxious.      Past Medical History  Diagnosis Date  . Thyroid disease   . Migraine   . GERD (gastroesophageal reflux disease)   . Seasonal allergies   . Intermittent asthma     Past Surgical History  Procedure Laterality Date  . Colon surgery      Diverticula was removed  . Appendectomy      Social History:  reports that she has never smoked. She does not have any smokeless tobacco history on file. She reports that she drinks about .5 ounces of alcohol per week. Her drug history is not on file.  Allergies  Allergen Reactions  . Erythromycin Hives  . Percocet [Oxycodone-Acetaminophen] Itching  . Phenergan [Promethazine Hcl] Other (See Comments)    Red lines up arm  . Sulfa Antibiotics Nausea And Vomiting    Cassandra Zuniga History  Problem Relation Age of Onset  . Angina Paternal Cassandra Zuniga   . GI Disease Cassandra Zuniga     Gastroparesis    Prior to Admission medications   Medication Sig Start Date End Date Taking? Authorizing Provider  albuterol  (PROVENTIL HFA;VENTOLIN HFA) 108 (90 BASE) MCG/ACT inhaler Inhale 2 puffs into the lungs every 6 (six) hours as needed for wheezing or shortness of breath.   Yes Historical Provider, MD  butalbital-acetaminophen-caffeine (FIORICET, ESGIC) 50-325-40 MG per tablet Take 1 tablet by mouth 2 (two) times daily as needed for headache or migraine.   Yes Historical Provider, MD  famotidine (PEPCID) 20 MG tablet Take 20 mg by mouth 2 (two) times daily.   Yes Historical Provider, MD  fluticasone (FLONASE) 50 MCG/ACT nasal spray Place 1 spray into both nostrils daily.   Yes Historical Provider, MD  levothyroxine (SYNTHROID, LEVOTHROID) 100 MCG tablet Take 100 mcg by mouth daily before breakfast.   Yes Historical Provider, MD  rizatriptan (MAXALT-MLT) 10 MG disintegrating tablet Take 10 mg by mouth every 2 (two) hours as needed for migraine. May repeat in 2 hours if needed   Yes Historical Provider, MD  promethazine (PHENERGAN) 12.5 MG tablet Take 12.5 mg by mouth every 6 (six) hours as needed for nausea or vomiting.    Historical Provider, MD    Physical Exam: Filed Vitals:   04/23/14 1228 04/23/14 1422 04/23/14 1654  BP: 143/75 128/90 120/80  Pulse: 74 67 62  Resp: 26 16 18   SpO2: 100% 99% 96%    Physical Exam  Constitutional: Thin, well developed, NAD HENT: Normocephalic. Oropharynx is clear and moist.  Eyes: Conjunctivae and EOM are normal. CVS: Bradycardic, normal rhythm, S1/S2 +, no murmurs, no gallops, no carotid bruit.  Pulmonary: Effort and breath sounds normal, no stridor, rhonchi, wheezes, rales.  Abdominal: Soft. BS +,  no distension, tenderness, rebound or guarding.  Musculoskeletal: No edema and no tenderness.  Neuro: Alert. Normal muscle tone coordination. No cranial nerve deficit. Strength is intact.  There is a subjective change to sensation in the right arm, feels more dull than left to light touch Skin: Skin is warm and dry. No rash noted. Not diaphoretic. No erythema. No pallor.   Psychiatric: Normal mood and affect. Behavior, judgment, thought content normal.   Labs on Admission:  Basic Metabolic Panel:  Recent Labs Lab 04/23/14 1331  NA 139  K 5.1  CL 104  CO2 19  GLUCOSE 83  BUN 12  CREATININE 0.64  CALCIUM 9.7   Liver Function Tests: No results found for this basename: AST, ALT, ALKPHOS, BILITOT, PROT, ALBUMIN,  in the last 168 hours No results found for this basename: LIPASE, AMYLASE,  in the last 168 hours No results found for this basename: AMMONIA,  in the last 168 hours CBC:  Recent Labs Lab 04/23/14 1331  WBC 8.0  HGB 14.3  HCT 43.1  MCV 87.6  PLT 233   Cardiac Enzymes: No results found for this basename: CKTOTAL, CKMB, CKMBINDEX, TROPONINI,  in the last 168 hours BNP: No components found with this basename: POCBNP,  CBG: No results found for this basename: GLUCAP,  in the last 168 hours  EKG: Normal sinus rhythm, no ST/T wave changes.  Rate 75, PVC  Debe CoderMULLEN, Jo-Ann Johanning, MD  [facility] Pager [phone]  If 7PM-7AM, please contact night-coverage [email or web address] Password [email or web address] 04/23/2014, 5:09 PM

## 2014-04-24 DIAGNOSIS — E039 Hypothyroidism, unspecified: Secondary | ICD-10-CM | POA: Diagnosis not present

## 2014-04-24 DIAGNOSIS — J45909 Unspecified asthma, uncomplicated: Secondary | ICD-10-CM | POA: Diagnosis not present

## 2014-04-24 DIAGNOSIS — R0789 Other chest pain: Secondary | ICD-10-CM | POA: Diagnosis not present

## 2014-04-24 DIAGNOSIS — K219 Gastro-esophageal reflux disease without esophagitis: Secondary | ICD-10-CM | POA: Diagnosis not present

## 2014-04-24 DIAGNOSIS — R079 Chest pain, unspecified: Secondary | ICD-10-CM

## 2014-04-24 LAB — BASIC METABOLIC PANEL
ANION GAP: 11 (ref 5–15)
BUN: 12 mg/dL (ref 6–23)
CO2: 27 meq/L (ref 19–32)
Calcium: 9.5 mg/dL (ref 8.4–10.5)
Chloride: 103 mEq/L (ref 96–112)
Creatinine, Ser: 0.78 mg/dL (ref 0.50–1.10)
GFR calc Af Amer: >90 mL/min (ref >90)
GLUCOSE: 94 mg/dL (ref 70–99)
Potassium: 4 mEq/L (ref 3.7–5.3)
SODIUM: 141 meq/L (ref 137–147)

## 2014-04-24 LAB — TROPONIN I
Troponin I: <0.3 ng/mL (ref <0.30)
Troponin I: <0.3 ng/mL (ref <0.30)

## 2014-04-24 LAB — TSH: TSH: 2.25 u[IU]/mL (ref 0.350–4.500)

## 2014-04-24 NOTE — ED Provider Notes (Signed)
Medical screening examination/treatment/procedure(s) were conducted as a shared visit with non-physician practitioner(s) and myself.  I personally evaluated the patient during the encounter.   EKG Interpretation   Date/Time:  Saturday April 23 2014 12:25:10 EDT Ventricular Rate:  75 PR Interval:  155 QRS Duration: 95 QT Interval:  376 QTC Calculation: 420 R Axis:   81 Text Interpretation:  Sinus rhythm Confirmed by Gwendolyn GrantWALDEN  MD, Phuoc Huy ([id number])  on 04/23/2014 12:29:46 PM      Patient here with CP. No risk factors, but classic story for ACS. EKG ok. Labs reassuring. Attempted to call Cards for consult, but no answer in several hours. Admitted to [facility].  Elwin MochaBlair Haile Toppins, MD 04/24/14 [phone]

## 2014-04-24 NOTE — Discharge Summary (Addendum)
Physician Discharge Summary  Cassandra Zuniga:[id number] DOB: 1970-11-07 DOA: 04/23/2014  PCP: No primary provider on file.  Admit date: 04/23/2014 Discharge date: 04/24/2014  Time spent: > 35 minutes  Recommendations for Outpatient Follow-up:  1. Consider further evaluation for chest discomfort. Pt ruled out for ACS while in house  Discharge Diagnoses:  Please see list below   Discharge Condition: stable  Diet recommendation: Heart healthy  Filed Weights   04/23/14 1920  Weight: 58.06 kg (128 lb)    History of present illness:  Pt is a 43 year old Caucasian female with past medical history of migraine, GERD, hypothyroidism and asthma who presented to the ED complaining of chest discomfort.  Hospital Course:  Chest discomfort - EKG showing no acute changes - Cardiac enzymes x3 negative - Patient ambulated in the hallway prior to discharge and reported no chest discomfort with activity. - Recommend further workup with outpatient primary care physician  Hypothyroidism -Stable on Synthroid continue home regimen  Asthma - Stable continue home bronchodilator regimen  GERD - May have contributed to principle problem, recommend continuing famotidine at home dose  Procedures:  none  Consultations:  none  Discharge Exam: Filed Vitals:   04/24/14 0546  BP: 110/64  Pulse: 54  Temp: 97.5 F (36.4 C)  Resp: 20    General: pt in nad, alert and awake Cardiovascular: rrr, no mrg Respiratory: cta bl, no wheezes  Discharge Instructions You were cared for by a hospitalist during your hospital stay. If you have any questions about your discharge medications or the care you received while you were in the hospital after you are discharged, you can call the unit and asked to speak with the hospitalist on call if the hospitalist that took care of you is not available. Once you are discharged, your primary care physician will handle any further medical issues. Please note that  NO REFILLS for any discharge medications will be authorized once you are discharged, as it is imperative that you return to your primary care physician (or establish a relationship with a primary care physician if you do not have one) for your aftercare needs so that they can reassess your need for medications and monitor your lab values.  Discharge Instructions   Call MD for:  redness, tenderness, or signs of infection (pain, swelling, redness, odor or green/yellow discharge around incision site)    Complete by:  As directed      Call MD for:  severe uncontrolled pain    Complete by:  As directed      Call MD for:  temperature >100.4    Complete by:  As directed      Diet - low sodium heart healthy    Complete by:  As directed      Discharge instructions    Complete by:  As directed   Please f/u with your pcp for further evaluation of your chest discomfort.     Increase activity slowly    Complete by:  As directed             Medication List         albuterol 108 (90 BASE) MCG/ACT inhaler  Commonly known as:  PROVENTIL HFA;VENTOLIN HFA  Inhale 2 puffs into the lungs every 6 (six) hours as needed for wheezing or shortness of breath.     butalbital-acetaminophen-caffeine 50-325-40 MG per tablet  Commonly known as:  FIORICET, ESGIC  Take 1 tablet by mouth 2 (two) times daily as needed for headache  or migraine.     famotidine 20 MG tablet  Commonly known as:  PEPCID  Take 20 mg by mouth 2 (two) times daily.     fluticasone 50 MCG/ACT nasal spray  Commonly known as:  FLONASE  Place 1 spray into both nostrils daily.     levothyroxine 100 MCG tablet  Commonly known as:  SYNTHROID, LEVOTHROID  Take 100 mcg by mouth daily before breakfast.     promethazine 12.5 MG tablet  Commonly known as:  PHENERGAN  Take 12.5 mg by mouth every 6 (six) hours as needed for nausea or vomiting.     rizatriptan 10 MG disintegrating tablet  Commonly known as:  MAXALT-MLT  Take 10 mg by mouth  every 2 (two) hours as needed for migraine. May repeat in 2 hours if needed       Allergies  Allergen Reactions  . Erythromycin Hives  . Percocet [Oxycodone-Acetaminophen] Itching  . Phenergan [Promethazine Hcl] Other (See Comments)    Red lines up arm  . Sulfa Antibiotics Nausea And Vomiting      The results of significant diagnostics from this hospitalization (including imaging, microbiology, ancillary and laboratory) are listed below for reference.    Significant Diagnostic Studies: Dg Chest 2 View  04/23/2014   CLINICAL DATA:  43 year old female with chest pain. Former smoker. Initial encounter.  EXAM: CHEST  2 VIEW  COMPARISON:  None.  FINDINGS: Lung volumes are within normal limits. Normal cardiac size and mediastinal contours. Visualized tracheal air column is within normal limits. Cervical ACDF hardware incidentally noted. No pneumothorax or pulmonary edema. No pleural effusion or consolidation. No pulmonary nodule or confluent pulmonary opacity identified. No acute osseous abnormality identified. 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  IMPRESSION: No acute cardiopulmonary abnormality.   Electronically Signed   By: Augusto GambleLee  Hall M.D.   On: 04/23/2014 14:29    Microbiology: No results found for this or any previous visit (from the past 240 hour(s)).   Labs: Basic Metabolic Panel:  Recent Labs Lab 04/23/14 1331 04/24/14 0140  NA 139 141  K 5.1 4.0  CL 104 103  CO2 19 27  GLUCOSE 83 94  BUN 12 12  CREATININE 0.64 0.78  CALCIUM 9.7 9.5   Liver Function Tests: No results found for this basename: AST, ALT, ALKPHOS, BILITOT, PROT, ALBUMIN,  in the last 168 hours No results found for this basename: LIPASE, AMYLASE,  in the last 168 hours No results found for this basename: AMMONIA,  in the last 168 hours CBC:  Recent Labs Lab 04/23/14 1331  WBC 8.0  HGB  14.3  HCT 43.1  MCV 87.6  PLT 233   Cardiac Enzymes:  Recent Labs Lab 04/23/14 1917 04/24/14 0140 04/24/14 0708  TROPONINI <0.30 <0.30 <0.30   BNP: BNP (last 3 results) No results found for this basename: PROBNP,  in the last 8760 hours CBG: No results found for this basename: GLUCAP,  in the last 168 hours     Signed:  Penny PiaVEGA, Selso Mannor  [facility] 04/24/2014, 2:13 PM  Addendum: On day of discharge patient was not complaining of any chest discomfort

## 2014-05-21 ENCOUNTER — Encounter (HOSPITAL_COMMUNITY): Payer: Self-pay | Admitting: Emergency Medicine

## 2014-10-01 ENCOUNTER — Emergency Department (HOSPITAL_COMMUNITY): Payer: Medicaid Other

## 2014-10-01 ENCOUNTER — Encounter (HOSPITAL_COMMUNITY): Payer: Self-pay

## 2014-10-01 ENCOUNTER — Emergency Department (HOSPITAL_COMMUNITY)
Admission: EM | Admit: 2014-10-01 | Discharge: 2014-10-01 | Disposition: A | Discharge status: Home / Self Care | Payer: Medicaid Other | Attending: Emergency Medicine | Admitting: Emergency Medicine

## 2014-10-01 DIAGNOSIS — K219 Gastro-esophageal reflux disease without esophagitis: Secondary | ICD-10-CM | POA: Insufficient documentation

## 2014-10-01 DIAGNOSIS — E079 Disorder of thyroid, unspecified: Secondary | ICD-10-CM | POA: Diagnosis not present

## 2014-10-01 DIAGNOSIS — Y9389 Activity, other specified: Secondary | ICD-10-CM | POA: Insufficient documentation

## 2014-10-01 DIAGNOSIS — S6992XA Unspecified injury of left wrist, hand and finger(s), initial encounter: Secondary | ICD-10-CM

## 2014-10-01 DIAGNOSIS — Z79899 Other long term (current) drug therapy: Secondary | ICD-10-CM | POA: Insufficient documentation

## 2014-10-01 DIAGNOSIS — Z792 Long term (current) use of antibiotics: Secondary | ICD-10-CM | POA: Insufficient documentation

## 2014-10-01 DIAGNOSIS — X58XXXA Exposure to other specified factors, initial encounter: Secondary | ICD-10-CM | POA: Diagnosis not present

## 2014-10-01 DIAGNOSIS — Z87442 Personal history of urinary calculi: Secondary | ICD-10-CM | POA: Diagnosis not present

## 2014-10-01 DIAGNOSIS — S60932A Unspecified superficial injury of left thumb, initial encounter: Secondary | ICD-10-CM | POA: Diagnosis not present

## 2014-10-01 DIAGNOSIS — G43909 Migraine, unspecified, not intractable, without status migrainosus: Secondary | ICD-10-CM | POA: Insufficient documentation

## 2014-10-01 DIAGNOSIS — J4520 Mild intermittent asthma, uncomplicated: Secondary | ICD-10-CM | POA: Insufficient documentation

## 2014-10-01 DIAGNOSIS — Y9289 Other specified places as the place of occurrence of the external cause: Secondary | ICD-10-CM | POA: Diagnosis not present

## 2014-10-01 DIAGNOSIS — Y998 Other external cause status: Secondary | ICD-10-CM | POA: Diagnosis not present

## 2014-10-01 DIAGNOSIS — Z8742 Personal history of other diseases of the female genital tract: Secondary | ICD-10-CM | POA: Insufficient documentation

## 2014-10-01 DIAGNOSIS — Z7951 Long term (current) use of inhaled steroids: Secondary | ICD-10-CM | POA: Diagnosis not present

## 2014-10-01 NOTE — ED Notes (Signed)
Pt presents with c/o left thumb injury that occurred approx 9 days ago. Pt reports she was working with a Engineer, manufacturing systemsscrewdriver and jabbed herself around the knuckle area. Pt has a small red spot to that area, reports pain with movement.

## 2014-10-01 NOTE — ED Provider Notes (Signed)
CSN: [id number]     Arrival date & time 10/01/14  1614 History  This chart was scribed for non-physician practitioner working with Doug Sou, MD by Elveria Rising, ED Scribe. This patient was seen in room WTR9/WTR9 and the patient's care was started at 5:55 PM.   Chief Complaint  Patient presents with  . Finger Injury   The history is provided by the patient. No language interpreter was used.   HPI Comments: Cassandra Zuniga is a 44 y.o. female who presents to the Emergency Department with left thumb injury incurred nine days ago. Patient reports working with a screwdriver and accidentally jabbing herself twice near her MCP joint. Patient presents pain at her at the site with radiation into her thumb. Patient denies worsening or improvement since the injury. Patient reports that she's been unable to preform mundane tasks due to pain severity. Patient has not taken any pain medication. Patient reports updated Tetanus in 2011.  Denies fever or redness. Denies the area feeling hot.  Past Medical History  Diagnosis Date  . Kidney stones   . Kidney infection   . Diverticulitis   . Asthma   . Thyroid disease   . Migraine   . GERD (gastroesophageal reflux disease)   . Seasonal allergies   . Intermittent asthma    Past Surgical History  Procedure Laterality Date  . Tonsillectomy    . Thyroglossal duct cyst    . Cervical disc surgery    . Tubal ligation    . Knee surgery      bilateral  . Colon surgery      Diverticula was removed  . Appendectomy     Family History  Problem Relation Age of Onset  . Diabetes Other   . Cancer Other   . Angina Paternal Grandfather   . GI Disease Mother     Gastroparesis   History  Substance Use Topics  . Smoking status: Never Smoker   . Smokeless tobacco: Not on file  . Alcohol Use: 0.5 oz/week   OB History    No data available     Review of Systems  Constitutional: Negative for fever and chills.  Musculoskeletal:       Left thumb pain   Skin: Positive for color change.   Allergies  Erythromycin; Erythromycin; Percocet; Phenergan; Sulfa antibiotics; Sulfa antibiotics; and Phenergan  Home Medications   Prior to Admission medications   Medication Sig Start Date End Date Taking? Authorizing Provider  albuterol (PROVENTIL HFA;VENTOLIN HFA) 108 (90 BASE) MCG/ACT inhaler Inhale 2 puffs into the lungs every 6 (six) hours as needed for wheezing or shortness of breath.    Historical Provider, MD  butalbital-acetaminophen-caffeine (FIORICET, ESGIC) 50-325-40 MG per tablet Take 1-2 tablets by mouth every 4 (four) hours as needed for headache.    Historical Provider, MD  butalbital-acetaminophen-caffeine (FIORICET, ESGIC) 50-325-40 MG per tablet Take 1 tablet by mouth 2 (two) times daily as needed for headache or migraine.    Historical Provider, MD  cephALEXin (KEFLEX) 250 MG capsule Take 1 capsule (250 mg total) by mouth 4 (four) times daily. 01/06/14   Tatyana A Kirichenko, PA-C  cyclobenzaprine (FLEXERIL) 10 MG tablet Take 1 tablet (10 mg total) by mouth 2 (two) times daily as needed for muscle spasms. 01/06/14   Tatyana A Kirichenko, PA-C  famotidine (PEPCID) 20 MG tablet Take 20 mg by mouth 2 (two) times daily.    Historical Provider, MD  famotidine (PEPCID) 20 MG tablet Take 20 mg by  mouth 2 (two) times daily.    Historical Provider, MD  fluticasone (FLONASE) 50 MCG/ACT nasal spray Place 1 spray into both nostrils daily.    Historical Provider, MD  ibuprofen (ADVIL,MOTRIN) 100 MG tablet Take 100 mg by mouth every 6 (six) hours as needed for pain or fever.    Historical Provider, MD  levothyroxine (SYNTHROID, LEVOTHROID) 100 MCG tablet Take 100 mcg by mouth daily before breakfast.    Historical Provider, MD  levothyroxine (SYNTHROID, LEVOTHROID) 100 MCG tablet Take 100 mcg by mouth daily before breakfast.    Historical Provider, MD  Multiple Vitamin (MULTIVITAMIN WITH MINERALS) TABS tablet Take 1 tablet by mouth daily.    Historical  Provider, MD  naproxen (NAPROSYN) 500 MG tablet Take 1 tablet (500 mg total) by mouth 2 (two) times daily. 01/06/14   Tatyana A Kirichenko, PA-C  oxyCODONE-acetaminophen (PERCOCET/ROXICET) 5-325 MG per tablet Take 1 tablet by mouth every 4 (four) hours as needed for severe pain.    Historical Provider, MD  promethazine (PHENERGAN) 12.5 MG tablet Take 12.5 mg by mouth every 6 (six) hours as needed for nausea or vomiting.    Historical Provider, MD  rizatriptan (MAXALT-MLT) 10 MG disintegrating tablet Take 10 mg by mouth as needed for migraine. May repeat in 2 hours if needed    Historical Provider, MD  rizatriptan (MAXALT-MLT) 10 MG disintegrating tablet Take 10 mg by mouth every 2 (two) hours as needed for migraine. May repeat in 2 hours if needed    Historical Provider, MD   Triage Vitals: BP 129/76 mmHg  Pulse 80  Temp(Src) 98.1 F (36.7 C) (Oral)  Resp 16  SpO2 100%  LMP 09/08/2014 (Approximate) Physical Exam  Constitutional: She is oriented to person, place, and time. She appears well-developed and well-nourished. No distress.  HENT:  Head: Normocephalic and atraumatic.  Eyes: EOM are normal.  Neck: Neck supple. No tracheal deviation present.  Cardiovascular: Normal rate.   Pulmonary/Chest: Effort normal. No respiratory distress.  Musculoskeletal:       Left hand: She exhibits decreased range of motion (decrease in felxion and extension) and tenderness. She exhibits normal capillary refill, no deformity, no laceration and no swelling. Normal sensation noted. Decreased strength (due to pain) noted.       Hands: Neurological: She is alert and oriented to person, place, and time.  Skin: Skin is warm and dry.  Psychiatric: She has a normal mood and affect. Her behavior is normal.  Nursing note and vitals reviewed.   ED Course  Procedures (including critical care time)  COORDINATION OF CARE: 5:59 PM- Plans to apply thumb splint. Patient advised to treat ice and anti inflammatory.  Discussed treatment plan with patient at bedside and patient agreed to plan.   Labs Review Labs Reviewed - No data to display  Imaging Review Dg Finger Thumb Left  10/01/2014   CLINICAL DATA:  Injured left thumb approximately 2 weeks ago, striking it with a screwdriver in the region of the IP joint. Initial encounter.  EXAM: LEFT THUMB 2+V  COMPARISON:  None.  FINDINGS: No evidence of acute fracture or dislocation. Joint spaces well preserved. Well-preserved bone mineral density. No intrinsic osseous abnormalities.  IMPRESSION: Normal examination.   Electronically Signed   By: Hulan Saashomas  Lawrence M.D.   On: 10/01/2014 17:29     EKG Interpretation None      MDM   Final diagnoses:  Finger injury, left, initial encounter    Apply thumb spica splint RICE Pt does not  want to take any medications, if she decides she does want medication, I recommended she take NSAIDs. She has been isntructed to call the hand physician on Monday morning to schedule f/u  43 y.o.Cassandra Zuniga's evaluation in the Emergency Department is complete. It has been determined that no acute conditions requiring further emergency intervention are present at this time. The patient/guardian have been advised of the diagnosis and plan. We have discussed signs and symptoms that warrant return to the ED, such as changes or worsening in symptoms.  Vital signs are stable at discharge. Filed Vitals:   10/01/14 1647  BP: 129/76  Pulse: 80  Temp: 98.1 F (36.7 C)  Resp: 16    Patient/guardian has voiced understanding and agreed to follow-up with the PCP or specialist.   I personally performed the services described in this documentation, which was scribed in my presence. The recorded information has been reviewed and is accurate.    Dorthula Matas, PA-C 10/01/14 1810  Doug Sou, MD 10/01/14 [phone]

## 2014-10-01 NOTE — Discharge Instructions (Signed)
Puncture Wound °A puncture wound is an injury that extends through all layers of the skin and into the tissue beneath the skin (subcutaneous tissue). Puncture wounds become infected easily because germs often enter the body and go beneath the skin during the injury. Having a deep wound with a small entrance point makes it difficult for your caregiver to adequately clean the wound. This is especially true if you have stepped on a nail and it has passed through a dirty shoe or other situations where the wound is obviously contaminated. °CAUSES  °Many puncture wounds involve glass, nails, splinters, fish hooks, or other objects that enter the skin (foreign bodies). A puncture wound may also be caused by a human bite or animal bite. °DIAGNOSIS  °A puncture wound is usually diagnosed by your history and a physical exam. You may need to have an X-ray or an ultrasound to check for any foreign bodies still in the wound. °TREATMENT  °· Your caregiver will clean the wound as thoroughly as possible. Depending on the location of the wound, a bandage (dressing) may be applied. °· Your caregiver might prescribe antibiotic medicines. °· You may need a follow-up visit to check on your wound. Follow all instructions as directed by your caregiver. °HOME CARE INSTRUCTIONS  °· Change your dressing once per day, or as directed by your caregiver. If the dressing sticks, it may be removed by soaking the area in water. °· If your caregiver has given you follow-up instructions, it is very important that you return for a follow-up appointment. Not following up as directed could result in a chronic or permanent injury, pain, and disability. °· Only take over-the-counter or prescription medicines for pain, discomfort, or fever as directed by your caregiver. °· If you are given antibiotics, take them as directed. Finish them even if you start to feel better. °You may need a tetanus shot if: °· You cannot remember when you had your last tetanus  shot. °· You have never had a tetanus shot. °If you got a tetanus shot, your arm may swell, get red, and feel warm to the touch. This is common and not a problem. If you need a tetanus shot and you choose not to have one, there is a rare chance of getting tetanus. Sickness from tetanus can be serious. °You may need a rabies shot if an animal bite caused your puncture wound. °SEEK MEDICAL CARE IF:  °· You have redness, swelling, or increasing pain in the wound. °· You have red streaks going away from the wound. °· You notice a bad smell coming from the wound or dressing. °· You have yellowish-white fluid (pus) coming from the wound. °· You are treated with an antibiotic for infection, but the infection is not getting better. °· You notice something in the wound, such as rubber from your shoe, cloth, or another object. °· You have a fever. °· You have severe pain. °· You have difficulty breathing. °· You feel dizzy or faint. °· You cannot stop vomiting. °· You lose feeling, develop numbness, or cannot move a limb below the wound. °· Your symptoms worsen. °MAKE SURE YOU: °· Understand these instructions. °· Will watch your condition. °· Will get help right away if you are not doing well or get worse. °Document Released: 06/12/2005 Document Revised: 11/25/2011 Document Reviewed: 02/19/2011 °[employer]® Patient Information ©2015 [employer]. This information is not intended to replace advice given to you by your health care provider. Make sure you discuss any questions you   have with your health care provider. ° °

## 2015-01-08 ENCOUNTER — Emergency Department: Admit: 2015-01-08 | Disposition: A | Discharge status: Home / Self Care | Payer: Self-pay | Admitting: Student

## 2015-01-08 LAB — URINALYSIS, COMPLETE
Bacteria: NONE SEEN
Bilirubin,UR: NEGATIVE
Blood: NEGATIVE
Glucose,UR: NEGATIVE mg/dL (ref 0–75)
Leukocyte Esterase: NEGATIVE
Nitrite: NEGATIVE
PROTEIN: NEGATIVE
Ph: 7 (ref 4.5–8.0)
SPECIFIC GRAVITY: 1.016 (ref 1.003–1.030)

## 2015-01-08 LAB — PREGNANCY, URINE: Pregnancy Test, Urine: NEGATIVE m[IU]/mL

## 2015-04-21 ENCOUNTER — Emergency Department
Admission: EM | Admit: 2015-04-21 | Discharge: 2015-04-21 | Disposition: A | Discharge status: Home / Self Care | Payer: Medicaid Other | Attending: Student | Admitting: Student

## 2015-04-21 DIAGNOSIS — Y93H2 Activity, gardening and landscaping: Secondary | ICD-10-CM | POA: Insufficient documentation

## 2015-04-21 DIAGNOSIS — Y9289 Other specified places as the place of occurrence of the external cause: Secondary | ICD-10-CM | POA: Insufficient documentation

## 2015-04-21 DIAGNOSIS — Y998 Other external cause status: Secondary | ICD-10-CM | POA: Diagnosis not present

## 2015-04-21 DIAGNOSIS — Z792 Long term (current) use of antibiotics: Secondary | ICD-10-CM | POA: Diagnosis not present

## 2015-04-21 DIAGNOSIS — Z791 Long term (current) use of non-steroidal anti-inflammatories (NSAID): Secondary | ICD-10-CM | POA: Insufficient documentation

## 2015-04-21 DIAGNOSIS — S00561A Insect bite (nonvenomous) of lip, initial encounter: Secondary | ICD-10-CM | POA: Insufficient documentation

## 2015-04-21 DIAGNOSIS — Z79899 Other long term (current) drug therapy: Secondary | ICD-10-CM | POA: Diagnosis not present

## 2015-04-21 DIAGNOSIS — W57XXXA Bitten or stung by nonvenomous insect and other nonvenomous arthropods, initial encounter: Secondary | ICD-10-CM | POA: Insufficient documentation

## 2015-04-21 DIAGNOSIS — Z7951 Long term (current) use of inhaled steroids: Secondary | ICD-10-CM | POA: Diagnosis not present

## 2015-04-21 MED ORDER — DIPHENHYDRAMINE HCL 50 MG PO CAPS
50.0000 mg | ORAL_CAPSULE | Freq: Once | ORAL | Status: AC
  Administered 2015-04-21: 50 mg via ORAL
  Filled 2015-04-21: qty 1

## 2015-04-21 MED ORDER — RANITIDINE HCL 150 MG PO TABS: 150.0000 mg | ORAL_TABLET | Freq: Two times a day (BID) | ORAL | Status: DC

## 2015-04-21 MED ORDER — CYPROHEPTADINE HCL 4 MG PO TABS: 4.0000 mg | ORAL_TABLET | Freq: Three times a day (TID) | ORAL | Status: DC | PRN

## 2015-04-21 MED ORDER — FAMOTIDINE 20 MG PO TABS
40.0000 mg | ORAL_TABLET | Freq: Once | ORAL | Status: AC
  Administered 2015-04-21: 40 mg via ORAL
  Filled 2015-04-21: qty 2

## 2015-04-21 NOTE — ED Provider Notes (Signed)
[facility] Emergency Department Provider Note ____________________________________________  Time seen: 66  I have reviewed the triage vital signs and the nursing notes.  HISTORY  Chief Complaint  Insect Bite  HPI Cassandra Zuniga is a 44 y.o. female reports to the ED for evaluation of swelling to the lip after she was bitten by an insect today. She was at home cutting grass at 4:30pm, on her riding mower, when she felt something crawl across her head. When she flipped her hair, she saw something fall down her face and immediately felt a bite to the inside of her lower lip. She she reports swelling to the left lower lip as well as some local tingling in that area. She denies any difficulty swallowing, breathing, or control secretions. She's been without any swelling to the tongue, throat, or or mucous membranes.  Past Medical History  Diagnosis Date  . Kidney stones   . Kidney infection   . Diverticulitis   . Asthma   . Thyroid disease   . Migraine   . GERD (gastroesophageal reflux disease)   . Seasonal allergies   . Intermittent asthma     Patient Active Problem List   Diagnosis Date Noted  . Chest pain 04/23/2014  . Migraine 04/23/2014  . Hypothyroidism 04/23/2014  . Bradycardia 04/23/2014  . Intermittent asthma 04/23/2014    Past Surgical History  Procedure Laterality Date  . Tonsillectomy    . Thyroglossal duct cyst    . Cervical disc surgery    . Tubal ligation    . Knee surgery      bilateral  . Colon surgery      Diverticula was removed  . Appendectomy      Current Outpatient Rx  Name  Route  Sig  Dispense  Refill  . albuterol (PROVENTIL HFA;VENTOLIN HFA) 108 (90 BASE) MCG/ACT inhaler   Inhalation   Inhale 2 puffs into the lungs every 6 (six) hours as needed for wheezing or shortness of breath.         . butalbital-acetaminophen-caffeine (FIORICET, ESGIC) 50-325-40 MG per tablet   Oral   Take 1-2 tablets by mouth every 4  (four) hours as needed for headache.         . butalbital-acetaminophen-caffeine (FIORICET, ESGIC) 50-325-40 MG per tablet   Oral   Take 1 tablet by mouth 2 (two) times daily as needed for headache or migraine.         . cephALEXin (KEFLEX) 250 MG capsule   Oral   Take 1 capsule (250 mg total) by mouth 4 (four) times daily.   28 capsule   0   . cyclobenzaprine (FLEXERIL) 10 MG tablet   Oral   Take 1 tablet (10 mg total) by mouth 2 (two) times daily as needed for muscle spasms.   20 tablet   0   . cyproheptadine (PERIACTIN) 4 MG tablet   Oral   Take 1 tablet (4 mg total) by mouth 3 (three) times daily as needed for allergies.   30 tablet   0   . famotidine (PEPCID) 20 MG tablet   Oral   Take 20 mg by mouth 2 (two) times daily.         . famotidine (PEPCID) 20 MG tablet   Oral   Take 20 mg by mouth 2 (two) times daily.         . fluticasone (FLONASE) 50 MCG/ACT nasal spray   Each Nare   Place 1 spray into  both nostrils daily.         Marland Kitchen ibuprofen (ADVIL,MOTRIN) 100 MG tablet   Oral   Take 100 mg by mouth every 6 (six) hours as needed for pain or fever.         . levothyroxine (SYNTHROID, LEVOTHROID) 100 MCG tablet   Oral   Take 100 mcg by mouth daily before breakfast.         . levothyroxine (SYNTHROID, LEVOTHROID) 100 MCG tablet   Oral   Take 100 mcg by mouth daily before breakfast.         . Multiple Vitamin (MULTIVITAMIN WITH MINERALS) TABS tablet   Oral   Take 1 tablet by mouth daily.         . naproxen (NAPROSYN) 500 MG tablet   Oral   Take 1 tablet (500 mg total) by mouth 2 (two) times daily.   30 tablet   0   . oxyCODONE-acetaminophen (PERCOCET/ROXICET) 5-325 MG per tablet   Oral   Take 1 tablet by mouth every 4 (four) hours as needed for severe pain.         . promethazine (PHENERGAN) 12.5 MG tablet   Oral   Take 12.5 mg by mouth every 6 (six) hours as needed for nausea or vomiting.         . ranitidine (ZANTAC) 150 MG  tablet   Oral   Take 1 tablet (150 mg total) by mouth 2 (two) times daily.   20 tablet   0   . rizatriptan (MAXALT-MLT) 10 MG disintegrating tablet   Oral   Take 10 mg by mouth as needed for migraine. May repeat in 2 hours if needed         . rizatriptan (MAXALT-MLT) 10 MG disintegrating tablet   Oral   Take 10 mg by mouth every 2 (two) hours as needed for migraine. May repeat in 2 hours if needed          Allergies Erythromycin; Erythromycin; Percocet; Phenergan; Sulfa antibiotics; Sulfa antibiotics; and Phenergan  Family History  Problem Relation Age of Onset  . Diabetes Other   . Cancer Other   . Angina Paternal Grandfather   . GI Disease Mother     Gastroparesis    Social History History  Substance Use Topics  . Smoking status: Never Smoker   . Smokeless tobacco: Not on file  . Alcohol Use: 0.5 oz/week   Review of Systems  Constitutional: Negative for fever. Eyes: Negative for visual changes. ENT: Negative for sore throat.mlip swelling as above Cardiovascular: Negative for chest pain. Respiratory: Negative for shortness of breath. Gastrointestinal: Negative for abdominal pain, vomiting and diarrhea. Genitourinary: Negative for dysuria. Musculoskeletal: Negative for back pain. Skin: Negative for rash. Neurological: Negative for headaches, focal weakness or numbness. ____________________________________________  PHYSICAL EXAM:  VITAL SIGNS: ED Triage Vitals  Enc Vitals Group     BP 04/21/15 1740 151/72 mmHg     Pulse Rate 04/21/15 1740 71     Resp 04/21/15 1740 18     Temp 04/21/15 1740 98.3 F (36.8 C)     Temp Source 04/21/15 1740 Oral     SpO2 04/21/15 1740 98 %     Weight 04/21/15 1740 131 lb (59.421 kg)     Height 04/21/15 1740  (1.676 m)     Head Cir --      Peak Flow --      Pain Score --      Pain Loc --  Pain Edu? --      Excl. in GC? --    Constitutional: Alert and oriented. Well appearing and in no distress. Eyes:  Conjunctivae are normal. PERRL. Normal extraocular movements. ENT   Head: Normocephalic and atraumatic.   Nose: No congestion/rhinnorhea.   Mouth/Throat: Mucous membranes are moist. Uvula midline without edema. Lower lip with focal edema to the left side.    Neck: Supple. No thyromegaly. Hematological/Lymphatic/Immunilogical: No cervical lymphadenopathy. Cardiovascular: Normal rate, regular rhythm.  Respiratory: Normal respiratory effort. No wheezes/rales/rhonchi. Gastrointestinal: Soft and nontender. No distention. Musculoskeletal: Nontender with normal range of motion in all extremities.  Neurologic:  Normal gait without ataxia. Normal speech and language. No gross focal neurologic deficits are appreciated. Skin:  Skin is warm, dry and intact. No rash noted. Psychiatric: Mood and affect are normal. Patient exhibits appropriate insight and judgment. ____________________________________________  PROCEDURES  Benadryl 50 mg PO  Famotidine 40 mg PO ____________________________________________  INITIAL IMPRESSION / ASSESSMENT AND PLAN / ED COURSE  Local edema to the lower lip due to local insect bite reaction. No indication of anaphylaxis. Acute H1 & H2 histamine blockade. Treatment with ice, Periactin, and ranitidine until resolution. Follow-up with primary provider as needed.  ____________________________________________  FINAL CLINICAL IMPRESSION(S) / ED DIAGNOSES  Final diagnoses:  Nonvenomous insect bite of lip without infection, initial encounter     Lissa Hoard, PA-C 04/21/15 1912  Gayla Doss, MD 04/21/15 2144

## 2015-04-21 NOTE — ED Notes (Signed)
Assess  Per PA

## 2015-04-21 NOTE — Discharge Instructions (Signed)
Insect Bite Mosquitoes, flies, fleas, bedbugs, and many other insects can bite. Insect bites are different from insect stings. A sting is when venom is injected into the skin. Some insect bites can transmit infectious diseases. SYMPTOMS  Insect bites usually turn red, swell, and itch for 2 to 4 days. They often go away on their own. TREATMENT  Your caregiver may prescribe antibiotic medicines if a bacterial infection develops in the bite. HOME CARE INSTRUCTIONS  Do not scratch the bite area.  Keep the bite area clean and dry. Wash the bite area thoroughly with soap and water.  Put ice or cool compresses on the bite area.  Put ice in a plastic bag.  Place a towel between your skin and the bag.  Leave the ice on for 20 minutes, 4 times a day for the first 2 to 3 days, or as directed.  You may apply a baking soda paste, cortisone cream, or calamine lotion to the bite area as directed by your caregiver. This can help reduce itching and swelling.  Only take over-the-counter or prescription medicines as directed by your caregiver.  If you are given antibiotics, take them as directed. Finish them even if you start to feel better. You may need a tetanus shot if:  You cannot remember when you had your last tetanus shot.  You have never had a tetanus shot.  The injury broke your skin. If you get a tetanus shot, your arm may swell, get red, and feel warm to the touch. This is common and not a problem. If you need a tetanus shot and you choose not to have one, there is a rare chance of getting tetanus. Sickness from tetanus can be serious. SEEK IMMEDIATE MEDICAL CARE IF:   You have increased pain, redness, or swelling in the bite area.  You see a red line on the skin coming from the bite.  You have a fever.  You have joint pain.  You have a headache or neck pain.  You have unusual weakness.  You have a rash.  You have chest pain or shortness of breath.  You have abdominal pain,  nausea, or vomiting.  You feel unusually tired or sleepy. MAKE SURE YOU:   Understand these instructions.  Will watch your condition.  Will get help right away if you are not doing well or get worse. Document Released: 10/10/2004 Document Revised: 11/25/2011 Document Reviewed: 04/03/2011 [facility] Patient Information 2015 [place], [place]. This information is not intended to replace advice given to you by your health care provider. Make sure you discuss any questions you have with your health care provider.  Apply ice to reduce symptoms. Take the prescription antihistamine medicines, as directed, until symptoms and swelling resolve.  Follow-up with your provider as needed.

## 2015-04-21 NOTE — ED Notes (Addendum)
Pt reports cutting grass today and felt something crawling on her face. She began to feel pain in lip and noticed swelling.

## 2015-05-23 ENCOUNTER — Encounter: Payer: Self-pay | Admitting: Neurology

## 2015-05-23 ENCOUNTER — Ambulatory Visit (INDEPENDENT_AMBULATORY_CARE_PROVIDER_SITE_OTHER): Payer: Medicaid Other | Admitting: Neurology

## 2015-05-23 VITALS — BP 118/72 | HR 73 | Ht 66.0 in | Wt 135.0 lb

## 2015-05-23 DIAGNOSIS — G43109 Migraine with aura, not intractable, without status migrainosus: Secondary | ICD-10-CM | POA: Diagnosis not present

## 2015-05-23 HISTORY — DX: Migraine with aura, not intractable, without status migrainosus: G43.109

## 2015-05-23 NOTE — Patient Instructions (Signed)

## 2015-05-23 NOTE — Progress Notes (Signed)
Reason for visit: Visual disturbance  Referring physician: Dr. Mardi Mainland is a 44 y.o. female  History of present illness:  Ms. 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 She was on Depakote initially, but she has not been on any medications since she was around 44 years old. She has not had any recurrence of her seizures. She does have a history of migraine headache, her headaches usually begin in the left occipital area, and spread to the left side of her head, occasionally associated with nausea and vomiting. The patient has some confusion with the headache. She denies any focal numbness or weakness or speech changes with the headache. She began having episodes of visual disturbances 13 years ago, the episodes are relatively infrequent. Over the last 2 months, the episodes of been a bit more frequent. The episodes are associated with a central scotoma that is surrounded by squiggly lines with colors. The central scotoma enlarges, and engulfs her entire visual field, and then dissipates. The entire event may take 15 minutes. She may or may not have a headache afterwards. Her mother and her sister also have migraine headache. She has Fioricet and Maxalt to take, Maxalt does help with the headache. She was seen by her optometrist, and she is referred to this office for an evaluation. The patient reports photophobia and phonophobia with the headaches. Stress, and bright flashing lights may bring on the headache.  Past Medical History  Diagnosis Date  . Kidney stones   . Kidney infection   . Diverticulitis   . Asthma   . Thyroid disease   . Migraine   . GERD (gastroesophageal reflux disease)   . Seasonal allergies   . Intermittent asthma   . Hypothyroidism   . Osteoporosis   . Scoliosis   . Seizures   . Classic migraine with aura 05/23/2015    Past Surgical History  Procedure Laterality Date  .  Tonsillectomy    . Thyroglossal duct cyst    . Cervical disc surgery    . Tubal ligation    . Knee surgery      bilateral  . Colon surgery      Diverticula was removed  . Appendectomy      Family History  Problem Relation Age of Onset  . Diabetes Other   . Cancer Other   . Angina Paternal Grandfather   . Diabetes Paternal Grandfather   . Stroke Paternal Grandfather   . GI Disease Mother     Gastroparesis  . Hypertension Mother   . Migraines Mother   . Hypothyroidism Mother   . Migraines Sister   . Cancer Maternal Aunt   . Cancer Maternal Uncle   . Stroke Maternal Grandmother   . Hypertension Maternal Grandfather   . Diabetes Paternal Grandmother   . Stroke Paternal Grandmother   . Hypothyroidism Son     Social history:  reports that she quit smoking about 25 years ago. She has never used smokeless tobacco. She reports that she drinks alcohol. She reports that she does not use illicit drugs.  Medications:  Prior to Admission medications   Medication Sig Start Date End Date Taking? Authorizing Provider  albuterol (PROVENTIL HFA;VENTOLIN HFA) 108 (90 BASE) MCG/ACT inhaler Inhale 2 puffs into the lungs every 6 (six) hours as needed for wheezing or shortness of breath.   Yes Historical Provider, MD  butalbital-acetaminophen-caffeine (FIORICET, ESGIC) 50-325-40 MG  per tablet Take 1-2 tablets by mouth every 4 (four) hours as needed for headache.   Yes Historical Provider, MD  butalbital-acetaminophen-caffeine (FIORICET, ESGIC) 50-325-40 MG per tablet Take 1 tablet by mouth 2 (two) times daily as needed for headache or migraine.   Yes Historical Provider, MD  cycloSPORINE (RESTASIS) 0.05 % ophthalmic emulsion Place 1 drop into both eyes daily.   Yes Historical Provider, MD  cyproheptadine (PERIACTIN) 4 MG tablet Take 1 tablet (4 mg total) by mouth 3 (three) times daily as needed for allergies. 04/21/15  Yes Jenise V Bacon Menshew, PA-C  famotidine (PEPCID) 20 MG tablet Take 20 mg by  mouth 2 (two) times daily as needed.    Yes Historical Provider, MD  fluticasone (FLONASE) 50 MCG/ACT nasal spray Place 1 spray into both nostrils daily.   Yes Historical Provider, MD  ibuprofen (ADVIL,MOTRIN) 100 MG tablet Take 100 mg by mouth every 6 (six) hours as needed for pain or fever.   Yes Historical Provider, MD  levothyroxine (SYNTHROID, LEVOTHROID) 112 MCG tablet Take 112 mcg by mouth daily before breakfast.   Yes Historical Provider, MD  Multiple Vitamin (MULTIVITAMIN WITH MINERALS) TABS tablet Take 1 tablet by mouth daily.   Yes Historical Provider, MD  naproxen (NAPROSYN) 500 MG tablet Take 1 tablet (500 mg total) by mouth 2 (two) times daily. 01/06/14  Yes Tatyana Kirichenko, PA-C  Olopatadine HCl (PAZEO) 0.7 % SOLN Apply 1 drop to eye daily.   Yes Historical Provider, MD  Propylene Glycol (SYSTANE BALANCE) 0.6 % SOLN Apply 2 drops to eye daily.   Yes Historical Provider, MD  rizatriptan (MAXALT-MLT) 10 MG disintegrating tablet Take 10 mg by mouth as needed for migraine. May repeat in 2 hours if needed   Yes Historical Provider, MD      Allergies  Allergen Reactions  . Erythromycin Hives  . Erythromycin Hives  . Percocet [Oxycodone-Acetaminophen] Itching  . Phenergan [Promethazine Hcl] Other (See Comments)    Red lines up arm  . Sulfa Antibiotics Nausea And Vomiting  . Sulfa Antibiotics Nausea And Vomiting  . Phenergan [Promethazine Hcl] Rash    ROS:  Out of a complete 14 system review of symptoms, the patient complains only of the following symptoms, and all other reviewed systems are negative.  Blurred vision Headache  Blood pressure 118/72, pulse 73, height 5\' 6"  (1.676 m), weight 135 lb (61.236 kg).  Physical Exam  General: The patient is alert and cooperative at the time of the examination.  Eyes: Pupils are equal, round, and reactive to light. Discs are flat bilaterally.  Neck: The neck is supple, no carotid bruits are noted.  Respiratory: The respiratory  examination is clear.  Cardiovascular: The cardiovascular examination reveals a regular rate and rhythm, no obvious murmurs or rubs are noted.  Skin: Extremities are without significant edema.  Neurologic Exam  Mental status: The patient is alert and oriented x 3 at the time of the examination. The patient has apparent normal recent and remote memory, with an apparently normal attention span and concentration ability.  Cranial nerves: Facial symmetry is present. There is good sensation of the face to pinprick and soft touch bilaterally. The strength of the facial muscles and the muscles to head turning and shoulder shrug are normal bilaterally. Speech is well enunciated, no aphasia or dysarthria is noted. Extraocular movements are full. Visual fields are full. The tongue is midline, and the patient has symmetric elevation of the soft palate. No obvious hearing deficits are noted.  Motor: The motor testing reveals 5 over 5 strength of all 4 extremities. Good symmetric motor tone is noted throughout.  Sensory: Sensory testing is intact to pinprick, soft touch, vibration sensation, and position sense on all 4 extremities. No evidence of extinction is noted.  Coordination: Cerebellar testing reveals good finger-nose-finger and heel-to-shin bilaterally.  Gait and station: Gait is normal. Tandem gait is normal. Romberg is negative. No drift is seen.  Reflexes: Deep tendon reflexes are symmetric and normal bilaterally. Toes are downgoing bilaterally.   Assessment/Plan:  1. Classic migraine  The patient has a history of migraine headaches, and she has visual aura with the headache. The patient may have the visual aura without headache as well. She has Maxalt to take was does seem to help, her headaches may last up to 3 days at a time. If the headaches become more frequent, she may require a daily prophylactic medication. Currently, she is only having 1 or 2 headaches a month. She will follow-up  through this office if needed.  Marlan Palau MD 05/23/2015 7:58 PM  [facility] [address] [place], [place] [address]  Phone [phone] Fax [phone]

## 2015-07-04 ENCOUNTER — Emergency Department: Payer: Medicaid Other

## 2015-07-04 ENCOUNTER — Emergency Department
Admission: EM | Admit: 2015-07-04 | Discharge: 2015-07-04 | Disposition: A | Discharge status: Home / Self Care | Payer: Medicaid Other | Attending: Emergency Medicine | Admitting: Emergency Medicine

## 2015-07-04 ENCOUNTER — Encounter: Payer: Self-pay | Admitting: Emergency Medicine

## 2015-07-04 DIAGNOSIS — E039 Hypothyroidism, unspecified: Secondary | ICD-10-CM | POA: Insufficient documentation

## 2015-07-04 DIAGNOSIS — W228XXA Striking against or struck by other objects, initial encounter: Secondary | ICD-10-CM | POA: Diagnosis not present

## 2015-07-04 DIAGNOSIS — S99921A Unspecified injury of right foot, initial encounter: Secondary | ICD-10-CM | POA: Diagnosis present

## 2015-07-04 DIAGNOSIS — Z791 Long term (current) use of non-steroidal anti-inflammatories (NSAID): Secondary | ICD-10-CM | POA: Diagnosis not present

## 2015-07-04 DIAGNOSIS — Z79899 Other long term (current) drug therapy: Secondary | ICD-10-CM | POA: Insufficient documentation

## 2015-07-04 DIAGNOSIS — Y9389 Activity, other specified: Secondary | ICD-10-CM | POA: Diagnosis not present

## 2015-07-04 DIAGNOSIS — Y9289 Other specified places as the place of occurrence of the external cause: Secondary | ICD-10-CM | POA: Insufficient documentation

## 2015-07-04 DIAGNOSIS — S90121A Contusion of right lesser toe(s) without damage to nail, initial encounter: Secondary | ICD-10-CM | POA: Insufficient documentation

## 2015-07-04 DIAGNOSIS — Z87891 Personal history of nicotine dependence: Secondary | ICD-10-CM | POA: Diagnosis not present

## 2015-07-04 DIAGNOSIS — Z7951 Long term (current) use of inhaled steroids: Secondary | ICD-10-CM | POA: Insufficient documentation

## 2015-07-04 DIAGNOSIS — Y998 Other external cause status: Secondary | ICD-10-CM | POA: Diagnosis not present

## 2015-07-04 NOTE — ED Notes (Signed)
States she kicked a drum several days ago  Injury to right 3 rd toe.Marland Kitchen.ambulates with limp d/t pain pt has toes buddy taped

## 2015-07-04 NOTE — ED Provider Notes (Signed)
[facility] Emergency Department Provider Note  ____________________________________________  Time seen: Approximately 11:34 AM  I have reviewed the triage vital signs and the nursing notes.   HISTORY  Chief Complaint Toe Pain    HPI Cassandra Zuniga is a 44 y.o. female patient complain edema and ecchymosis to the third digit right foot. Patient states she"stubbed"her toe 3 days ago.Patient stated there is no pain at this time. No palliative measures taken for this complaint.   Past Medical History  Diagnosis Date  . Kidney stones   . Kidney infection   . Diverticulitis   . Asthma   . Thyroid disease   . Migraine   . GERD (gastroesophageal reflux disease)   . Seasonal allergies   . Intermittent asthma   . Hypothyroidism   . Osteoporosis   . Scoliosis   . Seizures (HCC)   . Classic migraine with aura 05/23/2015    Patient Active Problem List   Diagnosis Date Noted  . Classic migraine with aura 05/23/2015  . Chest pain 04/23/2014  . Migraine 04/23/2014  . Hypothyroidism 04/23/2014  . Bradycardia 04/23/2014  . Intermittent asthma 04/23/2014    Past Surgical History  Procedure Laterality Date  . Tonsillectomy    . Thyroglossal duct cyst    . Cervical disc surgery    . Tubal ligation    . Knee surgery      bilateral  . Colon surgery      Diverticula was removed  . Appendectomy      Current Outpatient Rx  Name  Route  Sig  Dispense  Refill  . albuterol (PROVENTIL HFA;VENTOLIN HFA) 108 (90 BASE) MCG/ACT inhaler   Inhalation   Inhale 2 puffs into the lungs every 6 (six) hours as needed for wheezing or shortness of breath.         . butalbital-acetaminophen-caffeine (FIORICET, ESGIC) 50-325-40 MG per tablet   Oral   Take 1-2 tablets by mouth every 4 (four) hours as needed for headache.         . butalbital-acetaminophen-caffeine (FIORICET, ESGIC) 50-325-40 MG per tablet   Oral   Take 1 tablet by mouth 2 (two) times daily as  needed for headache or migraine.         . cycloSPORINE (RESTASIS) 0.05 % ophthalmic emulsion   Both Eyes   Place 1 drop into both eyes daily.         . cyproheptadine (PERIACTIN) 4 MG tablet   Oral   Take 1 tablet (4 mg total) by mouth 3 (three) times daily as needed for allergies.   30 tablet   0   . famotidine (PEPCID) 20 MG tablet   Oral   Take 20 mg by mouth 2 (two) times daily as needed.          . fluticasone (FLONASE) 50 MCG/ACT nasal spray   Each Nare   Place 1 spray into both nostrils daily.         Marland Kitchen ibuprofen (ADVIL,MOTRIN) 100 MG tablet   Oral   Take 100 mg by mouth every 6 (six) hours as needed for pain or fever.         . levothyroxine (SYNTHROID, LEVOTHROID) 112 MCG tablet   Oral   Take 112 mcg by mouth daily before breakfast.         . Multiple Vitamin (MULTIVITAMIN WITH MINERALS) TABS tablet   Oral   Take 1 tablet by mouth daily.         Marland Kitchen  naproxen (NAPROSYN) 500 MG tablet   Oral   Take 1 tablet (500 mg total) by mouth 2 (two) times daily.   30 tablet   0   . Olopatadine HCl (PAZEO) 0.7 % SOLN   Ophthalmic   Apply 1 drop to eye daily.         Marland Kitchen Propylene Glycol (SYSTANE BALANCE) 0.6 % SOLN   Ophthalmic   Apply 2 drops to eye daily.         . rizatriptan (MAXALT-MLT) 10 MG disintegrating tablet   Oral   Take 10 mg by mouth as needed for migraine. May repeat in 2 hours if needed           Allergies Erythromycin; Erythromycin; Percocet; Phenergan; Sulfa antibiotics; Sulfa antibiotics; and Phenergan  Family History  Problem Relation Age of Onset  . Diabetes Other   . Cancer Other   . Angina Paternal Grandfather   . Diabetes Paternal Grandfather   . Stroke Paternal Grandfather   . GI Disease Mother     Gastroparesis  . Hypertension Mother   . Migraines Mother   . Hypothyroidism Mother   . Migraines Sister   . Cancer Maternal Aunt   . Cancer Maternal Uncle   . Stroke Maternal Grandmother   . Hypertension Maternal  Grandfather   . Diabetes Paternal Grandmother   . Stroke Paternal Grandmother   . Hypothyroidism Son     Social History Social History  Substance Use Topics  . Smoking status: Former Smoker    Quit date: 07/17/1989  . Smokeless tobacco: Never Used  . Alcohol Use: 0.0 oz/week    0 Standard drinks or equivalent per week     Comment: very rarely    Review of Systems Constitutional: No fever/chills Eyes: No visual changes. ENT: No sore throat. Cardiovascular: Denies chest pain. Respiratory: Denies shortness of breath. Gastrointestinal: No abdominal pain.  No nausea, no vomiting.  No diarrhea.  No constipation. Genitourinary: Negative for dysuria. Musculoskeletal: He edema and ecchymosis third digit right foot. Skin: Negative for rash. Neurological: Negative for headaches, focal weakness or numbness. Endocrine:Hypothyroidism Allergic/Immunilogical: See medication list  10-point ROS otherwise negative.  ____________________________________________   PHYSICAL EXAM:  VITAL SIGNS: ED Triage Vitals  Enc Vitals Group     BP 07/04/15 1122 142/77 mmHg     Pulse Rate 07/04/15 1122 64     Resp 07/04/15 1122 20     Temp 07/04/15 1122 97.2 F (36.2 C)     Temp Source 07/04/15 1122 Oral     SpO2 07/04/15 1122 100 %     Weight 07/04/15 1122 135 lb (61.236 kg)     Height 07/04/15 1122  (1.676 m)     Head Cir --      Peak Flow --      Pain Score 07/04/15 1120 0     Pain Loc --      Pain Edu? --      Excl. in GC? --     Constitutional: Alert and oriented. Well appearing and in no acute distress. Eyes: Conjunctivae are normal. PERRL. EOMI. Head: Atraumatic. Nose: No congestion/rhinnorhea. Mouth/Throat: Mucous membranes are moist.  Oropharynx non-erythematous. Neck: No stridor.  No cervical spine tenderness to palpation. Hematological/Lymphatic/Immunilogical: No cervical lymphadenopathy. Cardiovascular: Normal rate, regular rhythm. Grossly normal heart sounds.  Good  peripheral circulation. Respiratory: Normal respiratory effort.  No retractions. Lungs CTAB. Gastrointestinal: Soft and nontender. No distention. No abdominal bruits. No CVA tenderness. Musculoskeletal: No lower extremity tenderness  nor edema.  No joint effusions. Neurologic:  Normal speech and language. No gross focal neurologic deficits are appreciated. No gait instability. Skin:  Skin is warm, dry and intact. No rash noted. Psychiatric: Mood and affect are normal. Speech and behavior are normal.  ____________________________________________   LABS (all labs ordered are listed, but only abnormal results are displayed)  Labs Reviewed - No data to display ____________________________________________  EKG  ____________________________________________  RADIOLOGY  X-rays negative for fracture. I, Joni Reiningonald K Smith, personally viewed and evaluated these images (plain radiographs) as part of my medical decision making.   ____________________________________________   PROCEDURES  Procedure(s) performed: None  Critical Care performed: No  ____________________________________________   INITIAL IMPRESSION / ASSESSMENT AND PLAN / ED COURSE  Pertinent labs & imaging results that were available during my care of the patient were reviewed by me and considered in my medical decision making (see chart for details).  Contusion right third toe. Skull is x-ray findings with patient. We'll have toes buddy taped in the ER patient were advised on home care. Patient may take over-the-counter Tylenol or ibuprofen for pain and swelling. ____________________________________________   FINAL CLINICAL IMPRESSION(S) / ED DIAGNOSES  Final diagnoses:  Toe contusion, right, initial encounter      Joni ReiningRonald K Smith, PA-C 07/04/15 1245  Jennye MoccasinBrian S Quigley, MD 07/04/15 [phone]

## 2015-07-04 NOTE — ED Notes (Signed)
Pt to ed with c/o right foot third toe pain x several days after stumping it.

## 2015-07-04 NOTE — Discharge Instructions (Signed)
Buddy tape toes for 3-5 days as needed. Advised Tylenol/Ibuprofen as needed for pain/edema.

## 2015-08-14 ENCOUNTER — Emergency Department (HOSPITAL_COMMUNITY): Payer: Medicaid Other

## 2015-08-14 ENCOUNTER — Encounter (HOSPITAL_COMMUNITY): Payer: Self-pay | Admitting: *Deleted

## 2015-08-14 ENCOUNTER — Emergency Department (HOSPITAL_COMMUNITY)
Admission: EM | Admit: 2015-08-14 | Discharge: 2015-08-14 | Disposition: A | Discharge status: Home / Self Care | Payer: Medicaid Other | Attending: Emergency Medicine | Admitting: Emergency Medicine

## 2015-08-14 DIAGNOSIS — M419 Scoliosis, unspecified: Secondary | ICD-10-CM | POA: Diagnosis not present

## 2015-08-14 DIAGNOSIS — Y9301 Activity, walking, marching and hiking: Secondary | ICD-10-CM | POA: Diagnosis not present

## 2015-08-14 DIAGNOSIS — Z7951 Long term (current) use of inhaled steroids: Secondary | ICD-10-CM | POA: Diagnosis not present

## 2015-08-14 DIAGNOSIS — J45909 Unspecified asthma, uncomplicated: Secondary | ICD-10-CM | POA: Diagnosis not present

## 2015-08-14 DIAGNOSIS — Z87442 Personal history of urinary calculi: Secondary | ICD-10-CM | POA: Diagnosis not present

## 2015-08-14 DIAGNOSIS — Y9289 Other specified places as the place of occurrence of the external cause: Secondary | ICD-10-CM | POA: Insufficient documentation

## 2015-08-14 DIAGNOSIS — Y998 Other external cause status: Secondary | ICD-10-CM | POA: Diagnosis not present

## 2015-08-14 DIAGNOSIS — X58XXXA Exposure to other specified factors, initial encounter: Secondary | ICD-10-CM | POA: Diagnosis not present

## 2015-08-14 DIAGNOSIS — Z87891 Personal history of nicotine dependence: Secondary | ICD-10-CM | POA: Insufficient documentation

## 2015-08-14 DIAGNOSIS — S99921A Unspecified injury of right foot, initial encounter: Secondary | ICD-10-CM | POA: Diagnosis not present

## 2015-08-14 DIAGNOSIS — G43109 Migraine with aura, not intractable, without status migrainosus: Secondary | ICD-10-CM | POA: Diagnosis not present

## 2015-08-14 DIAGNOSIS — M79671 Pain in right foot: Secondary | ICD-10-CM

## 2015-08-14 DIAGNOSIS — E039 Hypothyroidism, unspecified: Secondary | ICD-10-CM | POA: Diagnosis not present

## 2015-08-14 DIAGNOSIS — Z79899 Other long term (current) drug therapy: Secondary | ICD-10-CM | POA: Insufficient documentation

## 2015-08-14 DIAGNOSIS — Z8719 Personal history of other diseases of the digestive system: Secondary | ICD-10-CM | POA: Insufficient documentation

## 2015-08-14 DIAGNOSIS — Z87448 Personal history of other diseases of urinary system: Secondary | ICD-10-CM | POA: Diagnosis not present

## 2015-08-14 DIAGNOSIS — E119 Type 2 diabetes mellitus without complications: Secondary | ICD-10-CM | POA: Insufficient documentation

## 2015-08-14 MED ORDER — IBUPROFEN 400 MG PO TABS
600.0000 mg | ORAL_TABLET | Freq: Once | ORAL | Status: AC
  Administered 2015-08-14: 600 mg via ORAL
  Filled 2015-08-14: qty 1

## 2015-08-14 NOTE — Discharge Instructions (Signed)
Use OTC pain relievers such as tylenol or ibuprofen. RICE therapy (rest, ice, compress and elevate). Follow-up with orthopedics if her foot pain does not improve. Return to the emergency department with any new, worsening or concerning symptoms.   Musculoskeletal Pain Musculoskeletal pain is muscle and boney aches and pains. These pains can occur in any part of the body. Your caregiver may treat you without knowing the cause of the pain. They may treat you if blood or urine tests, X-rays, and other tests were normal.  CAUSES There is often not a definite cause or reason for these pains. These pains may be caused by a type of germ (virus). The discomfort may also come from overuse. Overuse includes working out too hard when your body is not fit. Boney aches also come from weather changes. Bone is sensitive to atmospheric pressure changes. HOME CARE INSTRUCTIONS   Ask when your test results will be ready. Make sure you get your test results.  Only take over-the-counter or prescription medicines for pain, discomfort, or fever as directed by your caregiver. If you were given medications for your condition, do not drive, operate machinery or power tools, or sign legal documents for 24 hours. Do not drink alcohol. Do not take sleeping pills or other medications that may interfere with treatment.  Continue all activities unless the activities cause more pain. When the pain lessens, slowly resume normal activities. Gradually increase the intensity and duration of the activities or exercise.  During periods of severe pain, bed rest may be helpful. Lay or sit in any position that is comfortable.  Putting ice on the injured area.  Put ice in a bag.  Place a towel between your skin and the bag.  Leave the ice on for 15 to 20 minutes, 3 to 4 times a day.  Follow up with your caregiver for continued problems and no reason can be found for the pain. If the pain becomes worse or does not go away, it may be  necessary to repeat tests or do additional testing. Your caregiver may need to look further for a possible cause. SEEK IMMEDIATE MEDICAL CARE IF:  You have pain that is getting worse and is not relieved by medications.  You develop chest pain that is associated with shortness or breath, sweating, feeling sick to your stomach (nauseous), or throw up (vomit).  Your pain becomes localized to the abdomen.  You develop any new symptoms that seem different or that concern you. MAKE SURE YOU:   Understand these instructions.  Will watch your condition.  Will get help right away if you are not doing well or get worse.   This information is not intended to replace advice given to you by your health care provider. Make sure you discuss any questions you have with your health care provider.   Document Released: 09/02/2005 Document Revised: 11/25/2011 Document Reviewed: 05/07/2013 Elsevier Interactive Patient Education 2016 [employer]  Cryotherapy Cryotherapy means treatment with cold. Ice or gel packs can be used to reduce both pain and swelling. Ice is the most helpful within the first 24 to 48 hours after an injury or flare-up from overusing a muscle or joint. Sprains, strains, spasms, burning pain, shooting pain, and aches can all be eased with ice. Ice can also be used when recovering from surgery. Ice is effective, has very few side effects, and is safe for most people to use. PRECAUTIONS  Ice is not a safe treatment option for people with:  Raynaud phenomenon.  This is a condition affecting small blood vessels in the extremities. Exposure to cold may cause your problems to return.  Cold hypersensitivity. There are many forms of cold hypersensitivity, including:  Cold urticaria. Red, itchy hives appear on the skin when the tissues begin to warm after being iced.  Cold erythema. This is a red, itchy rash caused by exposure to cold.  Cold hemoglobinuria. Red blood cells break down when  the tissues begin to warm after being iced. The hemoglobin that carry oxygen are passed into the urine because they cannot combine with blood proteins fast enough.  Numbness or altered sensitivity in the area being iced. If you have any of the following conditions, do not use ice until you have discussed cryotherapy with your caregiver:  Heart conditions, such as arrhythmia, angina, or chronic heart disease.  High blood pressure.  Healing wounds or open skin in the area being iced.  Current infections.  Rheumatoid arthritis.  Poor circulation.  Diabetes. Ice slows the blood flow in the region it is applied. This is beneficial when trying to stop inflamed tissues from spreading irritating chemicals to surrounding tissues. However, if you expose your skin to cold temperatures for too long or without the proper protection, you can damage your skin or nerves. Watch for signs of skin damage due to cold. HOME CARE INSTRUCTIONS Follow these tips to use ice and cold packs safely.  Place a dry or damp towel between the ice and skin. A damp towel will cool the skin more quickly, so you may need to shorten the time that the ice is used.  For a more rapid response, add gentle compression to the ice.  Ice for no more than 10 to 20 minutes at a time. The bonier the area you are icing, the less time it will take to get the benefits of ice.  Check your skin after 5 minutes to make sure there are no signs of a poor response to cold or skin damage.  Rest 20 minutes or more between uses.  Once your skin is numb, you can end your treatment. You can test numbness by very lightly touching your skin. The touch should be so light that you do not see the skin dimple from the pressure of your fingertip. When using ice, most people will feel these normal sensations in this order: cold, burning, aching, and numbness.  Do not use ice on someone who cannot communicate their responses to pain, such as small  children or people with dementia. HOW TO MAKE AN ICE PACK Ice packs are the most common way to use ice therapy. Other methods include ice massage, ice baths, and cryosprays. Muscle creams that cause a cold, tingly feeling do not offer the same benefits that ice offers and should not be used as a substitute unless recommended by your caregiver. To make an ice pack, do one of the following:  Place crushed ice or a bag of frozen vegetables in a sealable plastic bag. Squeeze out the excess air. Place this bag inside another plastic bag. Slide the bag into a pillowcase or place a damp towel between your skin and the bag.  Mix 3 parts water with 1 part rubbing alcohol. Freeze the mixture in a sealable plastic bag. When you remove the mixture from the freezer, it will be slushy. Squeeze out the excess air. Place this bag inside another plastic bag. Slide the bag into a pillowcase or place a damp towel between your skin and the  bag. SEEK MEDICAL CARE IF:  You develop white spots on your skin. This may give the skin a blotchy (mottled) appearance.  Your skin turns blue or pale.  Your skin becomes waxy or hard.  Your swelling gets worse. MAKE SURE YOU:   Understand these instructions.  Will watch your condition.  Will get help right away if you are not doing well or get worse.   This information is not intended to replace advice given to you by your health care provider. Make sure you discuss any questions you have with your health care provider.   Document Released: 04/29/2011 Document Revised: 09/23/2014 Document Reviewed: 04/29/2011 Elsevier Interactive Patient Education 2016 [employer]  [employer] Therapy Heat therapy can help ease sore, stiff, injured, and tight muscles and joints. Heat relaxes your muscles, which may help ease your pain.  RISKS AND COMPLICATIONS If you have any of the following conditions, do not use heat therapy unless your health care provider has approved:  Poor  circulation.  Healing wounds or scarred skin in the area being treated.  Diabetes, heart disease, or high blood pressure.  Not being able to feel (numbness) the area being treated.  Unusual swelling of the area being treated.  Active infections.  Blood clots.  Cancer.  Inability to communicate pain. This may include young children and people who have problems with their brain function (dementia).  Pregnancy. Heat therapy should only be used on old, pre-existing, or long-lasting (chronic) injuries. Do not use heat therapy on new injuries unless directed by your health care provider. HOW TO USE HEAT THERAPY There are several different kinds of heat therapy, including:  Moist heat pack.  Warm water bath.  Hot water bottle.  Electric heating pad.  Heated gel pack.  Heated wrap.  Electric heating pad. Use the heat therapy method suggested by your health care provider. Follow your health care provider's instructions on when and how to use heat therapy. GENERAL HEAT THERAPY RECOMMENDATIONS  Do not sleep while using heat therapy. Only use heat therapy while you are awake.  Your skin may turn pink while using heat therapy. Do not use heat therapy if your skin turns red.  Do not use heat therapy if you have new pain.  High heat or long exposure to heat can cause burns. Be careful when using heat therapy to avoid burning your skin.  Do not use heat therapy on areas of your skin that are already irritated, such as with a rash or sunburn. SEEK MEDICAL CARE IF:  You have blisters, redness, swelling, or numbness.  You have new pain.  Your pain is worse. MAKE SURE YOU:  Understand these instructions.  Will watch your condition.  Will get help right away if you are not doing well or get worse.   This information is not intended to replace advice given to you by your health care provider. Make sure you discuss any questions you have with your health care provider.     Document Released: 11/25/2011 Document Revised: 09/23/2014 Document Reviewed: 10/26/2013 Elsevier Interactive Patient Education [employer]

## 2015-08-14 NOTE — ED Provider Notes (Signed)
CSN: [id number]     Arrival date & time 08/14/15  1220 History  By signing my name below, I, Soijett Blue, attest that this documentation has been prepared under the direction and in the presence of Alveta HeimlichStevi Lewellyn Fultz, PA-C Electronically Signed: Soijett Blue, ED Scribe. 08/14/2015. 12:56 PM.   Chief Complaint  Patient presents with  . Foot Pain   The history is provided by the patient. No language interpreter was used.   Cassandra Zuniga is a 44 y.o. female who presents to the Emergency Department complaining of constant, right foot pain onset yesterday. She reports that she was ambulating when she heard a pop to the top of her right foot. She states that her right foot hurts with applying pressure to the top of the foot and that the right foot pain radiates to her right ankle. She notes that she is able to ambulate without difficulty. She has not tried any pain medications due to the pain subsiding as soon as the injury occurred. She denies gait problem, right foot swelling, color change, wound, rash, numbness, and any other symptoms.   Past Medical History  Diagnosis Date  . Kidney stones   . Kidney infection   . Diverticulitis   . Asthma   . Thyroid disease   . Migraine   . GERD (gastroesophageal reflux disease)   . Seasonal allergies   . Intermittent asthma   . Hypothyroidism   . Osteoporosis   . Scoliosis   . Seizures (HCC)   . Classic migraine with aura 05/23/2015   Past Surgical History  Procedure Laterality Date  . Tonsillectomy    . Thyroglossal duct cyst    . Cervical disc surgery    . Tubal ligation    . Knee surgery      bilateral  . Colon surgery      Diverticula was removed  . Appendectomy     Family History  Problem Relation Age of Onset  . Diabetes Other   . Cancer Other   . Angina Paternal Grandfather   . Diabetes Paternal Grandfather   . Stroke Paternal Grandfather   . GI Disease Mother     Gastroparesis  . Hypertension Mother   . Migraines Mother   .  Hypothyroidism Mother   . Migraines Sister   . Cancer Maternal Aunt   . Cancer Maternal Uncle   . Stroke Maternal Grandmother   . Hypertension Maternal Grandfather   . Diabetes Paternal Grandmother   . Stroke Paternal Grandmother   . Hypothyroidism Son    Social History  Substance Use Topics  . Smoking status: Former Smoker    Quit date: 07/17/1989  . Smokeless tobacco: Never Used  . Alcohol Use: 0.0 oz/week    0 Standard drinks or equivalent per week     Comment: very rarely   OB History    No data available     Review of Systems  Musculoskeletal: Positive for arthralgias (right foot).  All other systems reviewed and are negative.     Allergies  Erythromycin; Erythromycin; Percocet; Phenergan; Sulfa antibiotics; Sulfa antibiotics; and Phenergan  Home Medications   Prior to Admission medications   Medication Sig Start Date End Date Taking? Authorizing Provider  butalbital-acetaminophen-caffeine (FIORICET, ESGIC) 50-325-40 MG per tablet Take 1-2 tablets by mouth every 4 (four) hours as needed for headache.   Yes Historical Provider, MD  butalbital-acetaminophen-caffeine (FIORICET, ESGIC) 50-325-40 MG per tablet Take 1 tablet by mouth 2 (two) times daily as needed for  headache or migraine.   Yes Historical Provider, MD  cyproheptadine (PERIACTIN) 4 MG tablet Take 1 tablet (4 mg total) by mouth 3 (three) times daily as needed for allergies. 04/21/15  Yes Jenise V Bacon Menshew, PA-C  fluticasone (FLONASE) 50 MCG/ACT nasal spray Place 1 spray into both nostrils daily.   Yes Historical Provider, MD  levothyroxine (SYNTHROID, LEVOTHROID) 112 MCG tablet Take 112 mcg by mouth daily before breakfast.   Yes Historical Provider, MD  naproxen (NAPROSYN) 500 MG tablet Take 1 tablet (500 mg total) by mouth 2 (two) times daily. 01/06/14  Yes Tatyana Kirichenko, PA-C  Olopatadine HCl (PAZEO) 0.7 % SOLN Apply 1 drop to eye daily.   Yes Historical Provider, MD  Propylene Glycol (SYSTANE  BALANCE) 0.6 % SOLN Apply 2 drops to eye daily.   Yes Historical Provider, MD  albuterol (PROVENTIL HFA;VENTOLIN HFA) 108 (90 BASE) MCG/ACT inhaler Inhale 2 puffs into the lungs every 6 (six) hours as needed for wheezing or shortness of breath.    Historical Provider, MD  cycloSPORINE (RESTASIS) 0.05 % ophthalmic emulsion Place 1 drop into both eyes daily.    Historical Provider, MD  famotidine (PEPCID) 20 MG tablet Take 20 mg by mouth 2 (two) times daily as needed.     Historical Provider, MD  ibuprofen (ADVIL,MOTRIN) 100 MG tablet Take 100 mg by mouth every 6 (six) hours as needed for pain or fever.    Historical Provider, MD  Multiple Vitamin (MULTIVITAMIN WITH MINERALS) TABS tablet Take 1 tablet by mouth daily.    Historical Provider, MD  rizatriptan (MAXALT-MLT) 10 MG disintegrating tablet Take 10 mg by mouth as needed for migraine. May repeat in 2 hours if needed    Historical Provider, MD   BP 119/74 mmHg  Pulse 60  Temp(Src) 98.1 F (36.7 C) (Oral)  Resp 18  Ht  (1.676 m)  Wt 60.873 kg  BMI 21.67 kg/m2  SpO2 99%  LMP 06/14/2015 Physical Exam  Constitutional: She appears well-developed and well-nourished. No distress.  HENT:  Head: Normocephalic and atraumatic.  Right Ear: External ear normal.  Left Ear: External ear normal.  Eyes: Conjunctivae are normal. Right eye exhibits no discharge. Left eye exhibits no discharge. No scleral icterus.  Neck: Normal range of motion.  Cardiovascular: Normal rate.   Pulmonary/Chest: Effort normal.  Musculoskeletal: Normal range of motion.       Right ankle: Normal. She exhibits normal range of motion, no swelling, no ecchymosis, no deformity and normal pulse. No tenderness.       Feet:  No tenderness to palpation over the right foot or ankle. No obvious deformity. Patient retains full range of motion of the right ankle and toes without pain. She ambulates in the emergency department without assistance. No midfoot laxity.  Neurological:  She is alert. Coordination normal.  Skin: Skin is warm and dry.  Psychiatric: She has a normal mood and affect. Her behavior is normal.  Nursing note and vitals reviewed.   ED Course  Procedures (including critical care time) DIAGNOSTIC STUDIES: Oxygen Saturation is 99% on RA, nl by my interpretation.    COORDINATION OF CARE: 12:55 PM Discussed treatment plan with pt at bedside which includes right foot xray and pt agreed to plan.  Labs Review Labs Reviewed - No data to display  Imaging Review Dg Foot Complete Right  08/14/2015  CLINICAL DATA:  Pain at top of foot, no injury EXAM: RIGHT FOOT COMPLETE - 3+ VIEW COMPARISON:  07/04/2015 FINDINGS: Osseous  mineralization normal. Joint spaces preserved. No acute fracture, dislocation or bone destruction. Question pes cavus. IMPRESSION: No acute abnormalities. Question pes cavus. Electronically Signed   By: Ulyses Southward M.D.   On: 08/14/2015 14:00   I have personally reviewed and evaluated these images as part of my medical decision-making.   EKG Interpretation None      MDM   Final diagnoses:  Right foot pain    Patient presenting with right foot pain. Right foot is neurovascularly intact with FROM. Patient X-Ray negative for obvious fracture or dislocation. Pain managed in ED with motrin. Pt is able to ambulate with a steady gait. Conservative therapy recommended. Discussed RICE therapy and use of OTC pain relievers. Pt advised to follow up with orthopedics if symptoms persist. Return precautions discussed at bedside and given in discharge paperwork. Pt is stable for discharge.   I personally performed the services described in this documentation, which was scribed in my presence. The recorded information has been reviewed and is accurate.    Alveta Heimlich, PA-C 08/14/15 1440  Eber Hong, MD 08/16/15 1006

## 2015-08-14 NOTE — ED Notes (Signed)
Offered pt wheelchair, pt refused. Pt a,bulated to the bathroom. Pt given ice pack for right foot

## 2015-08-14 NOTE — ED Notes (Signed)
Declined W/C at D/C and was escorted to lobby by RN. 

## 2015-08-14 NOTE — ED Notes (Signed)
PT reports that while walking in a regular step the top of her RT foot pop . Pt reports pin to RT foot has increased . Pt ambulatory to room.

## 2015-08-17 ENCOUNTER — Emergency Department
Admission: EM | Admit: 2015-08-17 | Discharge: 2015-08-17 | Disposition: A | Discharge status: Home / Self Care | Payer: Medicaid Other | Attending: Emergency Medicine | Admitting: Emergency Medicine

## 2015-08-17 DIAGNOSIS — Z3202 Encounter for pregnancy test, result negative: Secondary | ICD-10-CM | POA: Diagnosis not present

## 2015-08-17 DIAGNOSIS — R112 Nausea with vomiting, unspecified: Secondary | ICD-10-CM | POA: Diagnosis not present

## 2015-08-17 DIAGNOSIS — Z87891 Personal history of nicotine dependence: Secondary | ICD-10-CM | POA: Insufficient documentation

## 2015-08-17 DIAGNOSIS — Z791 Long term (current) use of non-steroidal anti-inflammatories (NSAID): Secondary | ICD-10-CM | POA: Insufficient documentation

## 2015-08-17 DIAGNOSIS — R0981 Nasal congestion: Secondary | ICD-10-CM | POA: Diagnosis not present

## 2015-08-17 DIAGNOSIS — Z79899 Other long term (current) drug therapy: Secondary | ICD-10-CM | POA: Diagnosis not present

## 2015-08-17 LAB — URINALYSIS COMPLETE WITH MICROSCOPIC (ARMC ONLY)
BILIRUBIN URINE: NEGATIVE
GLUCOSE, UA: NEGATIVE mg/dL
HGB URINE DIPSTICK: NEGATIVE
KETONES UR: NEGATIVE mg/dL
Nitrite: NEGATIVE
PH: 7 (ref 5.0–8.0)
Protein, ur: 30 mg/dL — AB
Specific Gravity, Urine: 1.02 (ref 1.005–1.030)

## 2015-08-17 LAB — COMPREHENSIVE METABOLIC PANEL
ALT: 16 U/L (ref 14–54)
ANION GAP: 7 (ref 5–15)
AST: 24 U/L (ref 15–41)
Albumin: 4.7 g/dL (ref 3.5–5.0)
Alkaline Phosphatase: 46 U/L (ref 38–126)
BUN: 8 mg/dL (ref 6–20)
CHLORIDE: 107 mmol/L (ref 101–111)
CO2: 26 mmol/L (ref 22–32)
CREATININE: 0.56 mg/dL (ref 0.44–1.00)
Calcium: 9.1 mg/dL (ref 8.9–10.3)
GFR calc non Af Amer: >60 mL/min (ref >60)
Glucose, Bld: 116 mg/dL — ABNORMAL HIGH (ref 65–99)
POTASSIUM: 3.9 mmol/L (ref 3.5–5.1)
Sodium: 140 mmol/L (ref 135–145)
Total Bilirubin: 0.1 mg/dL — ABNORMAL LOW (ref 0.3–1.2)
Total Protein: 7.8 g/dL (ref 6.5–8.1)

## 2015-08-17 LAB — CBC
HCT: 37.7 % (ref 35.0–47.0)
HEMOGLOBIN: 12.8 g/dL (ref 12.0–16.0)
MCH: 28.7 pg (ref 26.0–34.0)
MCHC: 33.9 g/dL (ref 32.0–36.0)
MCV: 84.9 fL (ref 80.0–100.0)
PLATELETS: 205 10*3/uL (ref 150–440)
RBC: 4.44 MIL/uL (ref 3.80–5.20)
RDW: 14.3 % (ref 11.5–14.5)
WBC: 7.3 10*3/uL (ref 3.6–11.0)

## 2015-08-17 LAB — POCT PREGNANCY, URINE: Preg Test, Ur: NEGATIVE

## 2015-08-17 LAB — LIPASE, BLOOD: LIPASE: 41 U/L (ref 11–51)

## 2015-08-17 MED ORDER — ONDANSETRON HCL 4 MG/2ML IJ SOLN
4.0000 mg | Freq: Once | INTRAMUSCULAR | Status: DC | PRN
Start: 2015-08-17 — End: 2015-08-17
  Filled 2015-08-17: qty 2

## 2015-08-17 MED ORDER — ONDANSETRON 4 MG PO TBDP: 4.0000 mg | ORAL_TABLET | Freq: Three times a day (TID) | ORAL | Status: DC | PRN

## 2015-08-17 MED ORDER — ONDANSETRON HCL 4 MG/2ML IJ SOLN
4.0000 mg | Freq: Once | INTRAMUSCULAR | Status: AC
  Administered 2015-08-17: 4 mg via INTRAVENOUS

## 2015-08-17 MED ORDER — SODIUM CHLORIDE 0.9 % IV BOLUS (SEPSIS)
1000.0000 mL | Freq: Once | INTRAVENOUS | Status: AC
  Administered 2015-08-17: 1000 mL via INTRAVENOUS

## 2015-08-17 MED ORDER — OXYMETAZOLINE HCL 0.05 % NA SOLN
1.0000 | Freq: Once | NASAL | Status: AC
  Administered 2015-08-17: 1 via NASAL
  Filled 2015-08-17: qty 15

## 2015-08-17 MED ORDER — METOCLOPRAMIDE HCL 5 MG/ML IJ SOLN
10.0000 mg | Freq: Once | INTRAMUSCULAR | Status: AC
  Administered 2015-08-17: 10 mg via INTRAVENOUS
  Filled 2015-08-17: qty 2

## 2015-08-17 MED ORDER — LORAZEPAM 2 MG/ML IJ SOLN
1.0000 mg | Freq: Once | INTRAMUSCULAR | Status: AC
  Administered 2015-08-17: 1 mg via INTRAVENOUS
  Filled 2015-08-17: qty 1

## 2015-08-17 MED ORDER — DIPHENHYDRAMINE HCL 50 MG/ML IJ SOLN
25.0000 mg | Freq: Once | INTRAMUSCULAR | Status: AC
  Administered 2015-08-17: 25 mg via INTRAVENOUS
  Filled 2015-08-17: qty 1

## 2015-08-17 NOTE — ED Notes (Signed)
Pt reports only able to take "a couple of sips at a time." Pt reports feeling nauseous after drinking. MD at bedside.

## 2015-08-17 NOTE — ED Notes (Signed)
Pt reports "i don't feel a difference (with nausea) and my face is hurting." MD notified. See MAR for medications.

## 2015-08-17 NOTE — ED Notes (Signed)
Per MD order, pt provided with ginger ale to drink.

## 2015-08-17 NOTE — ED Provider Notes (Addendum)
[facility] Emergency Department Provider Note  Time seen: 5:15 AM  I have reviewed the triage vital signs and the nursing notes.   HISTORY  Chief Complaint Emesis    HPI Cassandra Zuniga is a 44 y.o. female with a past medical history of asthma, migraines, gastric reflux, seizures, who presents the emergency department with nausea and vomiting. According to the patient for the past several days she has had cold-like symptoms with congestion and a sore throat. Today she states she has been nauseated with vomiting since 6 PM last night. Patient states she gets nauseated with significant vomiting approximately once or twice a year, she does not know why this happens, and no one has ever been able to figure it out per patient. She has not seen a GI doctor. Denies any fever, abdominal pain, chest pain, diarrhea, dysuria.     Past Medical History  Diagnosis Date  . Kidney stones   . Kidney infection   . Diverticulitis   . Asthma   . Thyroid disease   . Migraine   . GERD (gastroesophageal reflux disease)   . Seasonal allergies   . Intermittent asthma   . Hypothyroidism   . Osteoporosis   . Scoliosis   . Seizures (HCC)   . Classic migraine with aura 05/23/2015    Patient Active Problem List   Diagnosis Date Noted  . Classic migraine with aura 05/23/2015  . Chest pain 04/23/2014  . Migraine 04/23/2014  . Hypothyroidism 04/23/2014  . Bradycardia 04/23/2014  . Intermittent asthma 04/23/2014    Past Surgical History  Procedure Laterality Date  . Tonsillectomy    . Thyroglossal duct cyst    . Cervical disc surgery    . Tubal ligation    . Knee surgery      bilateral  . Colon surgery      Diverticula was removed  . Appendectomy      Current Outpatient Rx  Name  Route  Sig  Dispense  Refill  . albuterol (PROVENTIL HFA;VENTOLIN HFA) 108 (90 BASE) MCG/ACT inhaler   Inhalation   Inhale 2 puffs into the lungs every 6 (six) hours as needed for  wheezing or shortness of breath.         . butalbital-acetaminophen-caffeine (FIORICET, ESGIC) 50-325-40 MG per tablet   Oral   Take 1-2 tablets by mouth every 4 (four) hours as needed for headache.         . butalbital-acetaminophen-caffeine (FIORICET, ESGIC) 50-325-40 MG per tablet   Oral   Take 1 tablet by mouth 2 (two) times daily as needed for headache or migraine.         . cycloSPORINE (RESTASIS) 0.05 % ophthalmic emulsion   Both Eyes   Place 1 drop into both eyes daily.         . cyproheptadine (PERIACTIN) 4 MG tablet   Oral   Take 1 tablet (4 mg total) by mouth 3 (three) times daily as needed for allergies.   30 tablet   0   . famotidine (PEPCID) 20 MG tablet   Oral   Take 20 mg by mouth 2 (two) times daily as needed.          . fluticasone (FLONASE) 50 MCG/ACT nasal spray   Each Nare   Place 1 spray into both nostrils daily.         Marland Kitchen ibuprofen (ADVIL,MOTRIN) 100 MG tablet   Oral   Take 100 mg by mouth every 6 (six)  hours as needed for pain or fever.         . levothyroxine (SYNTHROID, LEVOTHROID) 112 MCG tablet   Oral   Take 112 mcg by mouth daily before breakfast.         . Multiple Vitamin (MULTIVITAMIN WITH MINERALS) TABS tablet   Oral   Take 1 tablet by mouth daily.         . naproxen (NAPROSYN) 500 MG tablet   Oral   Take 1 tablet (500 mg total) by mouth 2 (two) times daily.   30 tablet   0   . Olopatadine HCl (PAZEO) 0.7 % SOLN   Ophthalmic   Apply 1 drop to eye daily.         Marland Kitchen. Propylene Glycol (SYSTANE BALANCE) 0.6 % SOLN   Ophthalmic   Apply 2 drops to eye daily.         . rizatriptan (MAXALT-MLT) 10 MG disintegrating tablet   Oral   Take 10 mg by mouth as needed for migraine. May repeat in 2 hours if needed           Allergies Erythromycin; Erythromycin; Percocet; Phenergan; Sulfa antibiotics; Sulfa antibiotics; and Phenergan  Family History  Problem Relation Age of Onset  . Diabetes Other   . Cancer Other    . Angina Paternal Grandfather   . Diabetes Paternal Grandfather   . Stroke Paternal Grandfather   . GI Disease Mother     Gastroparesis  . Hypertension Mother   . Migraines Mother   . Hypothyroidism Mother   . Migraines Sister   . Cancer Maternal Aunt   . Cancer Maternal Uncle   . Stroke Maternal Grandmother   . Hypertension Maternal Grandfather   . Diabetes Paternal Grandmother   . Stroke Paternal Grandmother   . Hypothyroidism Son     Social History Social History  Substance Use Topics  . Smoking status: Former Smoker    Quit date: 07/17/1989  . Smokeless tobacco: Never Used  . Alcohol Use: 0.0 oz/week    0 Standard drinks or equivalent per week     Comment: very rarely    Review of Systems Constitutional: Negative for fever. ENT: Positive for nasal congestion. Cardiovascular: Negative for chest pain. Respiratory: Negative for shortness of breath. Gastrointestinal: Negative for abdominal pain positive for nausea and vomiting Musculoskeletal: Negative for back pain. Neurological: Negative for headache 10-point ROS otherwise negative.  ____________________________________________   PHYSICAL EXAM:  VITAL SIGNS: ED Triage Vitals  Enc Vitals Group     BP 08/17/15 0129 151/87 mmHg     Pulse Rate 08/17/15 0129 80     Resp 08/17/15 0129 20     Temp 08/17/15 0129 97.9 F (36.6 C)     Temp Source 08/17/15 0129 Oral     SpO2 08/17/15 0129 97 %     Weight 08/17/15 0129 134 lb (60.782 kg)     Height 08/17/15 0129 5\' 6"  (1.676 m)     Head Cir --      Peak Flow --      Pain Score 08/17/15 0127 9     Pain Loc --      Pain Edu? --      Excl. in GC? --     Constitutional: Alert and oriented. Well appearing and in no distress. Eyes: Normal exam ENT   Head: Normocephalic and atraumatic.   Mouth/Throat: Mucous membranes are moist. Cardiovascular: Normal rate, regular rhythm. No murmur Respiratory: Normal respiratory effort without tachypnea  nor retractions.  Breath sounds are clear Gastrointestinal: Soft and nontender. No distention.   Musculoskeletal: Nontender with normal range of motion in all extremities. No lower extremity tenderness or edema. Neurologic:  Normal speech and language. No gross focal neurologic deficits Skin:  Skin is warm, dry and intact.  Psychiatric: Mood and affect are normal. Speech and behavior are normal.   ____________________________________________     INITIAL IMPRESSION / ASSESSMENT AND PLAN / ED COURSE  Pertinent labs & imaging results that were available during my care of the patient were reviewed by me and considered in my medical decision making (see chart for details).  Patient presents the emergency department nausea and vomiting. States she is unable to keep liquids down today. States this happens once or twice a year, knowing can tell her why. States for the past 4 days she has also had nasal congestion. Labs are largely within normal limits, urinalysis pending. We will treat with IV Zofran, IV fluids, and closely monitor in the emergency department. Patient has no abdominal tenderness on exam, states normal bowel movement this morning.  Labs are within normal limits. Patient states sinus pressure, given Afrin in the emergency department. Patient continues with nausea, we will dose Reglan and continue to monitor in the emergency department. Patient care signed out to Dr. Silverio Lay.  ____________________________________________   FINAL CLINICAL IMPRESSION(S) / ED DIAGNOSES  Nausea and vomiting.   Minna Antis, MD 08/17/15 [id number]  Minna Antis, MD 08/17/15 [phone]

## 2015-08-17 NOTE — ED Provider Notes (Signed)
  Physical Exam  BP 124/78 mmHg  Pulse 72  Temp(Src) 97.9 F (36.6 C) (Oral)  Resp 16  Ht 5\' 6"  (1.676 m)  Wt 134 lb (60.782 kg)  BMI 21.64 kg/m2  SpO2 99%  LMP 06/14/2015  Physical Exam  ED Course  Procedures  MDM Care assumed at 7 am. Patient here overnight for vomiting. Given zofran and was still vomiting. Given reglan, benadryl, and second NS bolus. Labs unremarkable. UA showed no ketones. Vitals stable. Drinking gingerale now. Likely gastro. Will dc home with zofran odt. Reassured patient.   Richardean Canalavid H Yao, MD 08/17/15 [phone]

## 2015-08-17 NOTE — ED Notes (Signed)
Negative urine pregnancy test.

## 2015-08-17 NOTE — ED Notes (Signed)
Pt presents to ED with c/o N/V since 6 pm last night, cough and nasal congestion x 2-3 days, pt also c/o facial pain. Upon this RN entry to room pt vomiting, small amounts of green bile. Pt denies chest pain, shortness of breath, or fevers. Pt alert and oriented x 4, skin warm and dry, no increased work in breathing noted.

## 2015-08-17 NOTE — ED Notes (Addendum)
Patient ambulatory to triage with steady gait, without difficulty or distress noted; pt reports nasal congestion last couple days; since 6pm having N/V accomp by facial pain/pressure

## 2015-08-17 NOTE — Discharge Instructions (Signed)

## 2015-08-17 NOTE — ED Notes (Signed)
Pt sipping on ginger ale

## 2015-08-17 NOTE — ED Notes (Signed)
Unsuccessful IV attempt x2.  

## 2015-11-17 ENCOUNTER — Encounter: Payer: Self-pay | Admitting: Sports Medicine

## 2015-11-17 ENCOUNTER — Ambulatory Visit (INDEPENDENT_AMBULATORY_CARE_PROVIDER_SITE_OTHER): Payer: Medicaid Other

## 2015-11-17 ENCOUNTER — Ambulatory Visit (INDEPENDENT_AMBULATORY_CARE_PROVIDER_SITE_OTHER): Payer: Medicaid Other | Admitting: Sports Medicine

## 2015-11-17 DIAGNOSIS — M79671 Pain in right foot: Secondary | ICD-10-CM

## 2015-11-17 DIAGNOSIS — S93601A Unspecified sprain of right foot, initial encounter: Secondary | ICD-10-CM | POA: Diagnosis not present

## 2015-11-17 DIAGNOSIS — R600 Localized edema: Secondary | ICD-10-CM

## 2015-11-17 MED ORDER — TRAMADOL HCL 50 MG PO TABS
50.0000 mg | ORAL_TABLET | Freq: Three times a day (TID) | ORAL | Status: DC | PRN
Start: 2015-11-17 — End: 2017-04-09

## 2015-11-17 NOTE — Progress Notes (Signed)
Patient ID: Cassandra Zuniga, female   DOB: 1970/10/22, 45 y.o.   MRN: [id number] Subjective: Cassandra Zuniga is a 45 y.o. female patient who presents to office for evaluation of Right  foot pain. Patient complains of continued pain since Wednesday after stepping wrong and hurting foot. Hasn't tried any treatment. Pain worse with walking. Patient denies any other pedal complaints.    Patient Active Problem List   Diagnosis Date Noted  . Classic migraine with aura 05/23/2015  . Chest pain 04/23/2014  . Migraine 04/23/2014  . Hypothyroidism 04/23/2014  . Bradycardia 04/23/2014  . Intermittent asthma 04/23/2014    Current Outpatient Prescriptions on File Prior to Visit  Medication Sig Dispense Refill  . albuterol (PROVENTIL HFA;VENTOLIN HFA) 108 (90 BASE) MCG/ACT inhaler Inhale 2 puffs into the lungs every 6 (six) hours as needed for wheezing or shortness of breath.    . Ascorbic Acid (VITAMIN C PO) Take 1 tablet by mouth daily.    . Cholecalciferol (VITAMIN D PO) Take 1 tablet by mouth daily.    . cyproheptadine (PERIACTIN) 4 MG tablet Take 1 tablet (4 mg total) by mouth 3 (three) times daily as needed for allergies. 30 tablet 0  . levothyroxine (SYNTHROID, LEVOTHROID) 112 MCG tablet Take 112 mcg by mouth daily before breakfast.    . naproxen (NAPROSYN) 500 MG tablet Take 1 tablet (500 mg total) by mouth 2 (two) times daily. 30 tablet 0  . ondansetron (ZOFRAN ODT) 4 MG disintegrating tablet Take 1 tablet (4 mg total) by mouth every 8 (eight) hours as needed for nausea or vomiting. 20 tablet 0   No current facility-administered medications on file prior to visit.    Allergies  Allergen Reactions  . Erythromycin Hives  . Erythromycin Hives  . Percocet [Oxycodone-Acetaminophen] Itching  . Phenergan [Promethazine Hcl] Other (See Comments)    Red lines up arm  . Sulfa Antibiotics Nausea And Vomiting  . Sulfa Antibiotics Nausea And Vomiting  . Phenergan [Promethazine Hcl] Rash     Objective:  General: Alert and oriented x3 in no acute distress  Dermatology: No open lesions bilateral lower extremities, no webspace macerations, no ecchymosis bilateral, all nails x 10 are well manicured.  Vascular: Dorsalis Pedis and Posterior Tibial pedal pulses 2/4, Capillary Fill Time 3 seconds,(+) pedal hair growth bilateral, focal forefoot edema on right, Temperature gradient within normal limits.  Neurology: Michaell Cowing sensation intact via light touch bilateral.   Musculoskeletal: Mild tenderness with palpation at 1-3 metatarsal distally and with stressing mid tarsal joint, mild guarding with tuning fork on right, no pain with flexion and extension of digits. Pes cavus foot type. Strength within normal limits in all groups bilateral.  .   Xrays  Right Foot   Impression:Normal osseous mineralization, Pes cavus, no acute fracture/dislocation, mild soft tissue swelling.  Assessment and Plan: Problem List Items Addressed This Visit    None    Visit Diagnoses    Right foot pain    -  Primary    Relevant Orders    DG Foot Complete Right    Sprain of foot, right, initial encounter        Localized edema            -Complete examination performed -Xrays reviewed -Discussed treatement options -Applied unna boot to right foot advised stiff sole shoe; unna boot to be kept clean, dry, and intact for 1 week -Recommend protection, ice, elevation until symptoms -Rx Tramadol for pain -Patient to return to office  in 2 weeks or sooner if condition worsens.  Asencion Islamitorya Carma Dwiggins, DPM

## 2015-12-01 ENCOUNTER — Encounter: Payer: Self-pay | Admitting: Sports Medicine

## 2015-12-01 ENCOUNTER — Ambulatory Visit (INDEPENDENT_AMBULATORY_CARE_PROVIDER_SITE_OTHER): Payer: Medicaid Other | Admitting: Sports Medicine

## 2015-12-01 DIAGNOSIS — S93601D Unspecified sprain of right foot, subsequent encounter: Secondary | ICD-10-CM

## 2015-12-01 DIAGNOSIS — M79671 Pain in right foot: Secondary | ICD-10-CM

## 2015-12-01 DIAGNOSIS — R600 Localized edema: Secondary | ICD-10-CM

## 2015-12-01 MED ORDER — TRIAMCINOLONE ACETONIDE 10 MG/ML IJ SUSP: 10.0000 mg | Freq: Once | INTRAMUSCULAR | Status: AC

## 2015-12-01 NOTE — Progress Notes (Signed)
Patient ID: Cassandra Zuniga, female   DOB: [birth year], 45 y.o.   MRN: [id number]  Subjective: Cassandra Zuniga is a 45 y.o. female patient who returns to office for evaluation of Right  foot pain. Patient states that the unna boot helped for 1 week. States that after she cut it off she tried to walk in bedroom shoe and she had pain; Bought OTC inserts with improvement. Desires right hallux nail to be looked at next visit. Patient denies any other pedal complaints.    Patient Active Problem List   Diagnosis Date Noted  . Classic migraine with aura 05/23/2015  . Chest pain 04/23/2014  . Migraine 04/23/2014  . Hypothyroidism 04/23/2014  . Bradycardia 04/23/2014  . Intermittent asthma 04/23/2014    Current Outpatient Prescriptions on File Prior to Visit  Medication Sig Dispense Refill  . albuterol (PROVENTIL HFA;VENTOLIN HFA) 108 (90 BASE) MCG/ACT inhaler Inhale 2 puffs into the lungs every 6 (six) hours as needed for wheezing or shortness of breath.    . Ascorbic Acid (VITAMIN C PO) Take 1 tablet by mouth daily.    . cefdinir (OMNICEF) 300 MG capsule TK 1 C PO BID  0  . Cholecalciferol (VITAMIN D PO) Take 1 tablet by mouth daily.    . cyproheptadine (PERIACTIN) 4 MG tablet Take 1 tablet (4 mg total) by mouth 3 (three) times daily as needed for allergies. 30 tablet 0  . fluticasone (FLONASE) 50 MCG/ACT nasal spray SHAKE LQ AND U 2 SPRAYS NASALLY QD UTD  5  . levothyroxine (SYNTHROID, LEVOTHROID) 100 MCG tablet TK 1 T PO QAM  5  . levothyroxine (SYNTHROID, LEVOTHROID) 112 MCG tablet Take 112 mcg by mouth daily before breakfast.    . naproxen (NAPROSYN) 500 MG tablet Take 1 tablet (500 mg total) by mouth 2 (two) times daily. 30 tablet 0  . ondansetron (ZOFRAN ODT) 4 MG disintegrating tablet Take 1 tablet (4 mg total) by mouth every 8 (eight) hours as needed for nausea or vomiting. 20 tablet 0  . rizatriptan (MAXALT-MLT) 10 MG disintegrating tablet   3  . traMADol (ULTRAM) 50 MG tablet Take 1  tablet (50 mg total) by mouth every 8 (eight) hours as needed. 30 tablet 0   No current facility-administered medications on file prior to visit.    Allergies  Allergen Reactions  . Erythromycin Hives  . Erythromycin Hives  . Percocet [Oxycodone-Acetaminophen] Itching  . Phenergan [Promethazine Hcl] Other (See Comments)    Red lines up arm  . Sulfa Antibiotics Nausea And Vomiting  . Sulfa Antibiotics Nausea And Vomiting  . Phenergan [Promethazine Hcl] Rash    Objective:  General: Alert and oriented x3 in no acute distress  Dermatology: No open lesions bilateral lower extremities, no webspace macerations, no ecchymosis bilateral, all nails x 10 are well manicured however mild distal thickening at right hallux nail laterally.  Vascular: Dorsalis Pedis and Posterior Tibial pedal pulses 2/4, Capillary Fill Time 3 seconds,(+) pedal hair growth bilateral, decreased focal forefoot edema on right, Temperature gradient within normal limits.  Neurology: Michaell Cowing sensation intact via light touch bilateral.   Musculoskeletal: Mild tenderness with palpation at 1-3 metatarsal distally over extensor tendons. No pain with stressing mid tarsal joint,No pain with tuning fork on right, no pain with flexion and extension of digits. Pes cavus foot type. Strength within normal limits in all groups bilateral.   Assessment and Plan: Problem List Items Addressed This Visit    None    Visit Diagnoses  Right foot pain    -  Primary    Relevant Medications    triamcinolone acetonide (KENALOG) 10 MG/ML injection 10 mg (Start on 12/01/2015 11:30 AM)    Sprain of foot, right, subsequent encounter        Localized edema           -Complete examination performed -Discussed treatement options -After oral consent and aseptic prep, injected a mixture containing 1 ml of 2%  plain lidocaine, 1 ml 0.5% plain marcaine, 0.5 ml of kenalog 10 and 0.5 ml of dexamethasone phosphate into right 1-3 mets at across distal  extensor tendons on right. Post-injection care discussed with patient.  -Recommend protection, ice, elevation until symptoms -Recommend good supportive shoes with OTC inserts -Cont with Tramadol for pain as needed -Recommend fungal culture at next visit for right hallux; advised patient to allow the nail to grow out -Patient to return to office in 3-4 weeks or sooner if condition worsens.  Asencion Islamitorya Arabia Nylund, DPM

## 2016-01-02 ENCOUNTER — Encounter: Payer: Self-pay | Admitting: Sports Medicine

## 2016-01-02 ENCOUNTER — Ambulatory Visit (INDEPENDENT_AMBULATORY_CARE_PROVIDER_SITE_OTHER): Payer: Medicaid Other | Admitting: Sports Medicine

## 2016-01-02 VITALS — BP 120/68 | HR 62 | Resp 18

## 2016-01-02 DIAGNOSIS — S93601D Unspecified sprain of right foot, subsequent encounter: Secondary | ICD-10-CM

## 2016-01-02 DIAGNOSIS — M79671 Pain in right foot: Secondary | ICD-10-CM | POA: Diagnosis not present

## 2016-01-02 DIAGNOSIS — B351 Tinea unguium: Secondary | ICD-10-CM | POA: Diagnosis not present

## 2016-01-02 NOTE — Progress Notes (Signed)
Patient ID: Cassandra Zuniga, female   DOB: [birth year], 45 y.o.   MRN: [id number]  Subjective: Cassandra Zuniga is a 45 y.o. female patient who returns to office for evaluation of Right  foot pain. Patient states that the injection helped now has peeling skin at site. Desires right hallux nail to be looked at because of thickness. Patient denies any other pedal complaints.    Patient Active Problem List   Diagnosis Date Noted  . Classic migraine with aura 05/23/2015  . Chest pain 04/23/2014  . Migraine 04/23/2014  . Hypothyroidism 04/23/2014  . Bradycardia 04/23/2014  . Intermittent asthma 04/23/2014    Current Outpatient Prescriptions on File Prior to Visit  Medication Sig Dispense Refill  . albuterol (PROVENTIL HFA;VENTOLIN HFA) 108 (90 BASE) MCG/ACT inhaler Inhale 2 puffs into the lungs every 6 (six) hours as needed for wheezing or shortness of breath.    . Ascorbic Acid (VITAMIN C PO) Take 1 tablet by mouth daily.    . cefdinir (OMNICEF) 300 MG capsule TK 1 C PO BID  0  . Cholecalciferol (VITAMIN D PO) Take 1 tablet by mouth daily.    . cyproheptadine (PERIACTIN) 4 MG tablet Take 1 tablet (4 mg total) by mouth 3 (three) times daily as needed for allergies. 30 tablet 0  . fluticasone (FLONASE) 50 MCG/ACT nasal spray SHAKE LQ AND U 2 SPRAYS NASALLY QD UTD  5  . levothyroxine (SYNTHROID, LEVOTHROID) 100 MCG tablet TK 1 T PO QAM  5  . levothyroxine (SYNTHROID, LEVOTHROID) 112 MCG tablet Take 112 mcg by mouth daily before breakfast.    . naproxen (NAPROSYN) 500 MG tablet Take 1 tablet (500 mg total) by mouth 2 (two) times daily. 30 tablet 0  . ondansetron (ZOFRAN ODT) 4 MG disintegrating tablet Take 1 tablet (4 mg total) by mouth every 8 (eight) hours as needed for nausea or vomiting. 20 tablet 0  . rizatriptan (MAXALT-MLT) 10 MG disintegrating tablet   3  . traMADol (ULTRAM) 50 MG tablet Take 1 tablet (50 mg total) by mouth every 8 (eight) hours as needed. 30 tablet 0   Current  Facility-Administered Medications on File Prior to Visit  Medication Dose Route Frequency Provider Last Rate Last Dose  . triamcinolone acetonide (KENALOG) 10 MG/ML injection 10 mg  10 mg Other Once Asencion Islam, DPM        Allergies  Allergen Reactions  . Erythromycin Hives  . Erythromycin Hives  . Percocet [Oxycodone-Acetaminophen] Itching  . Phenergan [Promethazine Hcl] Other (See Comments)    Red lines up arm  . Sulfa Antibiotics Nausea And Vomiting  . Sulfa Antibiotics Nausea And Vomiting  . Phenergan [Promethazine Hcl] Rash    Objective:  General: Alert and oriented x3 in no acute distress  Dermatology: No open lesions bilateral lower extremities, no webspace macerations, no ecchymosis bilateral, all nails x 10 are well manicured however mild distal thickening at right hallux nail laterally.  Vascular: Dorsalis Pedis and Posterior Tibial pedal pulses 2/4, Capillary Fill Time 3 seconds,(+) pedal hair growth bilateral, decreased focal forefoot edema on right, Temperature gradient within normal limits.  Neurology: Michaell Cowing sensation intact via light touch bilateral.   Musculoskeletal: No tenderness with palpation at 1-3 metatarsal distally over extensor tendons. No pain with stressing mid tarsal joint,No pain with tuning fork on right, no pain with flexion and extension of digits. Pes cavus foot type. Strength within normal limits in all groups bilateral.   Assessment and Plan: Problem List Items Addressed  This Visit    None    Visit Diagnoses    Dermatophytosis of nail    -  Primary    r hallux    Relevant Orders    Fungus Culture with Smear    Right foot pain        improved    Sprain of foot, right, subsequent encounter        resolved       -Complete examination performed -Discussed treatement options -Recommend cortisone cream to flaky skin at previous injection site -Recommend good supportive shoes with OTC inserts -Cont with Tramadol for pain as  needed -Obtained fungal culture from right hallux; sent to [facility] -Patient to return to office in 4 weeks/review culture or sooner if condition worsens.  Asencion Islamitorya Littleton Haub, DPM

## 2016-01-30 ENCOUNTER — Encounter: Payer: Self-pay | Admitting: Sports Medicine

## 2016-01-30 ENCOUNTER — Ambulatory Visit (INDEPENDENT_AMBULATORY_CARE_PROVIDER_SITE_OTHER): Payer: Medicaid Other | Admitting: Sports Medicine

## 2016-01-30 ENCOUNTER — Telehealth: Payer: Self-pay | Admitting: *Deleted

## 2016-01-30 DIAGNOSIS — M79671 Pain in right foot: Secondary | ICD-10-CM

## 2016-01-30 DIAGNOSIS — B351 Tinea unguium: Secondary | ICD-10-CM | POA: Diagnosis not present

## 2016-01-30 MED ORDER — NUVAIL EX SOLN: 1.0000 [drp] | Freq: Every day | CUTANEOUS | Status: DC

## 2016-01-30 NOTE — Progress Notes (Signed)
Patient ID: Cassandra Zuniga, female   DOB: 11/07/1970, 45 y.o.   MRN: [id number] Subjective: Cassandra Zuniga is a 45 y.o. female patient seen today in office for fungal culture results. Patient has no other pedal complaints at this time.   Patient Active Problem List   Diagnosis Date Noted  . Classic migraine with aura 05/23/2015  . Chest pain 04/23/2014  . Migraine 04/23/2014  . Hypothyroidism 04/23/2014  . Bradycardia 04/23/2014  . Intermittent asthma 04/23/2014    Current Outpatient Prescriptions on File Prior to Visit  Medication Sig Dispense Refill  . albuterol (PROVENTIL HFA;VENTOLIN HFA) 108 (90 BASE) MCG/ACT inhaler Inhale 2 puffs into the lungs every 6 (six) hours as needed for wheezing or shortness of breath.    . Ascorbic Acid (VITAMIN C PO) Take 1 tablet by mouth daily.    . cefdinir (OMNICEF) 300 MG capsule TK 1 C PO BID  0  . Cholecalciferol (VITAMIN D PO) Take 1 tablet by mouth daily.    . cyproheptadine (PERIACTIN) 4 MG tablet Take 1 tablet (4 mg total) by mouth 3 (three) times daily as needed for allergies. 30 tablet 0  . fluticasone (FLONASE) 50 MCG/ACT nasal spray SHAKE LQ AND U 2 SPRAYS NASALLY QD UTD  5  . levothyroxine (SYNTHROID, LEVOTHROID) 100 MCG tablet TK 1 T PO QAM  5  . levothyroxine (SYNTHROID, LEVOTHROID) 112 MCG tablet Take 112 mcg by mouth daily before breakfast.    . naproxen (NAPROSYN) 500 MG tablet Take 1 tablet (500 mg total) by mouth 2 (two) times daily. 30 tablet 0  . ondansetron (ZOFRAN ODT) 4 MG disintegrating tablet Take 1 tablet (4 mg total) by mouth every 8 (eight) hours as needed for nausea or vomiting. 20 tablet 0  . rizatriptan (MAXALT-MLT) 10 MG disintegrating tablet   3  . traMADol (ULTRAM) 50 MG tablet Take 1 tablet (50 mg total) by mouth every 8 (eight) hours as needed. 30 tablet 0   Current Facility-Administered Medications on File Prior to Visit  Medication Dose Route Frequency Provider Last Rate Last Dose  . triamcinolone acetonide  (KENALOG) 10 MG/ML injection 10 mg  10 mg Other Once Asencion Islamitorya Hannia Matchett, DPM        Allergies  Allergen Reactions  . Erythromycin Hives  . Erythromycin Hives  . Percocet [Oxycodone-Acetaminophen] Itching  . Phenergan [Promethazine Hcl] Other (See Comments)    Red lines up arm  . Sulfa Antibiotics Nausea And Vomiting  . Sulfa Antibiotics Nausea And Vomiting  . Phenergan [Promethazine Hcl] Rash    Objective: Physical Exam  General: Well developed, nourished, no acute distress, awake, alert and oriented x 3  Vascular: Dorsalis pedis artery 2/4 bilateral, Posterior tibial artery 2/4 bilateral, skin temperature warm to warm proximal to distal bilateral lower extremities, no varicosities, pedal hair present bilateral.  Neurological: Gross sensation present via light touch bilateral.   Dermatological: Skin is warm, dry, and supple bilateral, Nails 1-10 are short and within normal limits except with mild thickness, and discolored at right hallux, no webspace macerations present bilateral, no open lesions present bilateral, no callus/corns/hyperkeratotic tissue present bilateral. No signs of infection bilateral.  Musculoskeletal: No boney deformities noted bilateral. Muscular strength within normal limits without painon range of motion. No pain with calf compression bilateral.  Fungal culture + saprophytic mold   Assessment and Plan:  Problem List Items Addressed This Visit    None    Visit Diagnoses    Dermatophytosis of nail    -  Primary    Fungal culture + mold    Relevant Orders    Hepatic function panel    CBC with Differential    Right foot pain          -Examined patient -Discussed treatment options for painful mycotic nails -Patient opt for oral Lamisil with full understanding of medication risks; ordered LFTs for review if within normal limits will proceed with sending Rx to pharmacy for lamisil; will call patient with lab results -Patient opt for topical also. A request  was placed for nuvail, if not covered recommend urea cream -Advised good hygiene habits -Patient to return in 6 weeks for follow up evaluation or sooner if symptoms worsen.  Asencion Islam, DPM

## 2016-01-30 NOTE — Telephone Encounter (Signed)
-----   Message from Asencion Islamitorya Stover, [place] sent at 01/30/2016  8:15 AM EDT ----- Regarding: Topical [place]  Can we get nuvail if not affordable for patient then Urea for nails. + fungal culture for mold Thanks Dr. Marylene LandStover

## 2016-01-31 ENCOUNTER — Telehealth: Payer: Self-pay | Admitting: Sports Medicine

## 2016-01-31 LAB — CBC WITH DIFFERENTIAL/PLATELET
BASOS: 1 %
Basophils Absolute: 0 10*3/uL (ref 0.0–0.2)
EOS (ABSOLUTE): 0.1 10*3/uL (ref 0.0–0.4)
Eos: 2 %
Hematocrit: 40.8 % (ref 34.0–46.6)
Hemoglobin: 13.3 g/dL (ref 11.1–15.9)
IMMATURE GRANS (ABS): 0 10*3/uL (ref 0.0–0.1)
Immature Granulocytes: 1 %
LYMPHS: 32 %
Lymphocytes Absolute: 1.9 10*3/uL (ref 0.7–3.1)
MCH: 28.2 pg (ref 26.6–33.0)
MCHC: 32.6 g/dL (ref 31.5–35.7)
MCV: 87 fL (ref 79–97)
MONOS ABS: 0.8 10*3/uL (ref 0.1–0.9)
Monocytes: 14 %
NEUTROS ABS: 3 10*3/uL (ref 1.4–7.0)
Neutrophils: 50 %
PLATELETS: 277 10*3/uL (ref 150–379)
RBC: 4.71 x10E6/uL (ref 3.77–5.28)
RDW: 14.4 % (ref 12.3–15.4)
WBC: 5.9 10*3/uL (ref 3.4–10.8)

## 2016-01-31 LAB — HEPATIC FUNCTION PANEL
ALBUMIN: 4.7 g/dL (ref 3.5–5.5)
ALT: 40 IU/L — AB (ref 0–32)
AST: 33 IU/L (ref 0–40)
Alkaline Phosphatase: 65 IU/L (ref 39–117)
BILIRUBIN TOTAL: 0.3 mg/dL (ref 0.0–1.2)
BILIRUBIN, DIRECT: 0.05 mg/dL (ref 0.00–0.40)
TOTAL PROTEIN: 7.7 g/dL (ref 6.0–8.5)

## 2016-01-31 NOTE — Telephone Encounter (Signed)
Reviewed LFTs. ALT elevated. Will hold off on Lamisil PO at this time. Patient to return in 6 weeks for repeat blood-work for possible re-consideration of Lamisil PO. In meantime, patient to use Nuvail or urea to affected nail(s) as ordered by office. -Dr. Marylene LandStover

## 2016-02-26 ENCOUNTER — Telehealth: Payer: Self-pay | Admitting: Neurology

## 2016-02-26 NOTE — Telephone Encounter (Signed)
Patient reports worsening of migraines that started since weather started changing so frequently, what scared her is the memory loss afterward, woke up with migraine this morning, going through menopause, not sleeping well at night, having night sweats. Wonders if seizures have returned, "has a hard time remembering, can be in middle of doing something and then she loses track of time, I don't know how I wind up doing what I'm doing"  [facility]

## 2016-02-26 NOTE — Telephone Encounter (Signed)
I called patient back to try and get her a sooner appt. I also advised that if she feels like she has had a seizure, she may need to be seen at Urgent care or ED.

## 2016-02-26 NOTE — Telephone Encounter (Signed)
We can work her into a 7:30 appt with Dr. Anne HahnWillis if needed.

## 2016-03-12 ENCOUNTER — Encounter: Payer: Self-pay | Admitting: Sports Medicine

## 2016-03-12 ENCOUNTER — Ambulatory Visit (INDEPENDENT_AMBULATORY_CARE_PROVIDER_SITE_OTHER): Payer: Medicaid Other | Admitting: Sports Medicine

## 2016-03-12 DIAGNOSIS — M79671 Pain in right foot: Secondary | ICD-10-CM

## 2016-03-12 DIAGNOSIS — B351 Tinea unguium: Secondary | ICD-10-CM

## 2016-03-12 NOTE — Progress Notes (Signed)
Patient ID: Cassandra BrunnerJulia A Bothun, female   DOB: 1971-08-28, 45 y.o.   MRN: [id number] Subjective: Cassandra BrunnerJulia A Zuniga is a 45 y.o. female patient seen today in office for follow up eval of right hallux nail; Rx for nuvail was given but patient did not get it because cost was $500; bought OTC topical and has been using it on right hallux nail. Patient has no other pedal complaints at this time.   Patient Active Problem List   Diagnosis Date Noted  . Classic migraine with aura 05/23/2015  . Chest pain 04/23/2014  . Migraine 04/23/2014  . Hypothyroidism 04/23/2014  . Bradycardia 04/23/2014  . Intermittent asthma 04/23/2014    Current Outpatient Prescriptions on File Prior to Visit  Medication Sig Dispense Refill  . albuterol (PROVENTIL HFA;VENTOLIN HFA) 108 (90 BASE) MCG/ACT inhaler Inhale 2 puffs into the lungs every 6 (six) hours as needed for wheezing or shortness of breath.    . Ascorbic Acid (VITAMIN C PO) Take 1 tablet by mouth daily.    . cefdinir (OMNICEF) 300 MG capsule TK 1 C PO BID  0  . Cholecalciferol (VITAMIN D PO) Take 1 tablet by mouth daily.    . cyproheptadine (PERIACTIN) 4 MG tablet Take 1 tablet (4 mg total) by mouth 3 (three) times daily as needed for allergies. 30 tablet 0  . Dermatological Products, Misc. (NUVAIL) SOLN Apply 1 drop topically daily. 1 Bottle 11  . fluticasone (FLONASE) 50 MCG/ACT nasal spray SHAKE LQ AND U 2 SPRAYS NASALLY QD UTD  5  . levothyroxine (SYNTHROID, LEVOTHROID) 100 MCG tablet TK 1 T PO QAM  5  . levothyroxine (SYNTHROID, LEVOTHROID) 112 MCG tablet Take 112 mcg by mouth daily before breakfast.    . naproxen (NAPROSYN) 500 MG tablet Take 1 tablet (500 mg total) by mouth 2 (two) times daily. 30 tablet 0  . ondansetron (ZOFRAN ODT) 4 MG disintegrating tablet Take 1 tablet (4 mg total) by mouth every 8 (eight) hours as needed for nausea or vomiting. 20 tablet 0  . rizatriptan (MAXALT-MLT) 10 MG disintegrating tablet   3  . traMADol (ULTRAM) 50 MG tablet  Take 1 tablet (50 mg total) by mouth every 8 (eight) hours as needed. 30 tablet 0   Current Facility-Administered Medications on File Prior to Visit  Medication Dose Route Frequency Provider Last Rate Last Dose  . triamcinolone acetonide (KENALOG) 10 MG/ML injection 10 mg  10 mg Other Once Asencion Islamitorya Novalee Horsfall, DPM        Allergies  Allergen Reactions  . Erythromycin Hives  . Erythromycin Hives  . Percocet [Oxycodone-Acetaminophen] Itching  . Phenergan [Promethazine Hcl] Other (See Comments)    Red lines up arm  . Sulfa Antibiotics Nausea And Vomiting  . Sulfa Antibiotics Nausea And Vomiting  . Phenergan [Promethazine Hcl] Rash    Objective: Physical Exam  General: Well developed, nourished, no acute distress, awake, alert and oriented x 3  Vascular: Dorsalis pedis artery 2/4 bilateral, Posterior tibial artery 2/4 bilateral, skin temperature warm to warm proximal to distal bilateral lower extremities, no varicosities, pedal hair present bilateral.  Neurological: Gross sensation present via light touch bilateral.   Dermatological: Skin is warm, dry, and supple bilateral, Nails 1-10 are short and within normal limits except with mild thickness, and discolored at right hallux, no webspace macerations present bilateral, no open lesions present bilateral, no callus/corns/hyperkeratotic tissue present bilateral. No signs of infection bilateral.  Musculoskeletal: No boney deformities noted bilateral. Muscular strength within normal limits without  painon range of motion. No pain with calf compression bilateral.  Fungal culture + saprophytic mold   Assessment and Plan:  Problem List Items Addressed This Visit    None    Visit Diagnoses    Dermatophytosis of nail    -  Primary    Relevant Orders    Hepatic Function Panel    Right foot pain          -Examined patient -Re-Discussed treatment options for painful mycotic nails -Re-orderded LFTs for re-consideration of lamisil; will call  patient with results -At this time patient to continue with OTC topical  -Advised good hygiene habits -Patient to return as needed for follow up evaluation or sooner if symptoms worsen.  Asencion Islamitorya Aarya Quebedeaux, DPM

## 2016-03-13 ENCOUNTER — Other Ambulatory Visit: Payer: Self-pay | Admitting: Sports Medicine

## 2016-03-13 ENCOUNTER — Telehealth: Payer: Self-pay | Admitting: Sports Medicine

## 2016-03-13 DIAGNOSIS — L603 Nail dystrophy: Secondary | ICD-10-CM

## 2016-03-13 LAB — HEPATIC FUNCTION PANEL
ALT: 16 IU/L (ref 0–32)
AST: 16 IU/L (ref 0–40)
Albumin: 4.5 g/dL (ref 3.5–5.5)
Alkaline Phosphatase: 65 IU/L (ref 39–117)
Bilirubin Total: <0.2 mg/dL (ref 0.0–1.2)
Bilirubin, Direct: 0.06 mg/dL (ref 0.00–0.40)
TOTAL PROTEIN: 7.1 g/dL (ref 6.0–8.5)

## 2016-03-13 MED ORDER — TERBINAFINE HCL 250 MG PO TABS: 250.0000 mg | ORAL_TABLET | Freq: Every day | ORAL | Status: DC

## 2016-03-13 MED ORDER — TERBINAFINE HCL 250 MG PO TABS
250.0000 mg | ORAL_TABLET | Freq: Every day | ORAL | Status: DC
Start: 2016-03-13 — End: 2016-03-13

## 2016-03-13 NOTE — Telephone Encounter (Signed)
Reviewed liver function tests with patient. All within normal limits. Will proceed with sending Lamisil 250mg  to pharmacy for patient to start taking. Patient to make follow up to be seen in 6 weeks -Dr. Marylene LandStover

## 2016-03-18 ENCOUNTER — Ambulatory Visit (INDEPENDENT_AMBULATORY_CARE_PROVIDER_SITE_OTHER): Payer: Medicaid Other | Admitting: Neurology

## 2016-03-18 ENCOUNTER — Encounter: Payer: Self-pay | Admitting: Neurology

## 2016-03-18 VITALS — BP 147/88 | HR 67 | Ht 66.0 in | Wt 146.0 lb

## 2016-03-18 DIAGNOSIS — G43109 Migraine with aura, not intractable, without status migrainosus: Secondary | ICD-10-CM

## 2016-03-18 DIAGNOSIS — R413 Other amnesia: Secondary | ICD-10-CM | POA: Diagnosis not present

## 2016-03-18 NOTE — Progress Notes (Signed)
Reason for visit: Migraine headache  Cassandra Zuniga is an 45 y.o. female  History of present illness:  Cassandra Zuniga is a 45 year old right-handed white female with a history of migraine headache. The patient indicates that her headaches are occurring with the usual frequency, they are generally activated by weather changes. The patient will take Maxalt or Excedrin Migraine which seems to help. Within the last several months, the patient has noted a new problem associated with a disturbance with memory. The patient is not recalling conversations that she has had with people, she is having difficulty retaining new information. She is not sleeping well at night because of hot flashes, but she denies that she is fatigued or sleepy during the day. She does not relate the troubles with memory and concentration to the time around her migraine. She does have a history of seizures as an adolescent. She has not been on any anticonvulsant medications recently. She denies any blackout episodes or sudden changes in lapses of memory. She comes to this office for an evaluation.  Past Medical History  Diagnosis Date  . Kidney stones   . Kidney infection   . Diverticulitis   . Asthma   . Thyroid disease   . Migraine   . GERD (gastroesophageal reflux disease)   . Seasonal allergies   . Intermittent asthma   . Hypothyroidism   . Osteoporosis   . Scoliosis   . Seizures (HCC)   . Classic migraine with aura 05/23/2015    Past Surgical History  Procedure Laterality Date  . Tonsillectomy    . Thyroglossal duct cyst    . Cervical disc surgery    . Tubal ligation    . Knee surgery      bilateral  . Colon surgery      Diverticula was removed  . Appendectomy      Family History  Problem Relation Age of Onset  . Diabetes Other   . Cancer Other   . Angina Paternal Grandfather   . Diabetes Paternal Grandfather   . Stroke Paternal Grandfather   . GI Disease Mother     Gastroparesis  .  Hypertension Mother   . Migraines Mother   . Hypothyroidism Mother   . Migraines Sister   . Cancer Maternal Aunt   . Cancer Maternal Uncle   . Stroke Maternal Grandmother   . Hypertension Maternal Grandfather   . Diabetes Paternal Grandmother   . Stroke Paternal Grandmother   . Hypothyroidism Son     Social history:  reports that she quit smoking about 26 years ago. She has never used smokeless tobacco. She reports that she drinks alcohol. She reports that she does not use illicit drugs.    Allergies  Allergen Reactions  . Erythromycin Hives  . Erythromycin Hives  . Percocet [Oxycodone-Acetaminophen] Itching  . Phenergan [Promethazine Hcl] Other (See Comments)    Red lines up arm  . Sulfa Antibiotics Nausea And Vomiting  . Sulfa Antibiotics Nausea And Vomiting  . Phenergan [Promethazine Hcl] Rash    Medications:  Prior to Admission medications   Medication Sig Start Date End Date Taking? Authorizing Provider  albuterol (PROVENTIL HFA;VENTOLIN HFA) 108 (90 BASE) MCG/ACT inhaler Inhale 2 puffs into the lungs every 6 (six) hours as needed for wheezing or shortness of breath.   Yes Historical Provider, MD  Ascorbic Acid (VITAMIN C PO) Take 1 tablet by mouth daily.   Yes Historical Provider, MD  Cholecalciferol (VITAMIN D PO) Take 1  tablet by mouth daily.   Yes Historical Provider, MD  cyproheptadine (PERIACTIN) 4 MG tablet Take 1 tablet (4 mg total) by mouth 3 (three) times daily as needed for allergies. 04/21/15  Yes Lissa HoardJenise V Bacon Menshew, PA-C  Dermatological Products, Misc. (NUVAIL) SOLN Apply 1 drop topically daily. 01/30/16  Yes Titorya Stover, DPM  fluticasone (FLONASE) 50 MCG/ACT nasal spray SHAKE LQ AND U 2 SPRAYS NASALLY QD UTD 10/18/15  Yes Historical Provider, MD  levothyroxine (SYNTHROID, LEVOTHROID) 100 MCG tablet TK 1 T PO QAM 10/11/15  Yes Historical Provider, MD  naproxen (NAPROSYN) 500 MG tablet Take 1 tablet (500 mg total) by mouth 2 (two) times daily. 01/06/14  Yes  Tatyana Kirichenko, PA-C  ondansetron (ZOFRAN ODT) 4 MG disintegrating tablet Take 1 tablet (4 mg total) by mouth every 8 (eight) hours as needed for nausea or vomiting. 08/17/15  Yes Minna AntisKevin Paduchowski, MD  rizatriptan (MAXALT-MLT) 10 MG disintegrating tablet  08/23/15  Yes Historical Provider, MD  terbinafine (LAMISIL) 250 MG tablet Take 1 tablet (250 mg total) by mouth daily. 03/13/16  Yes Titorya Stover, DPM  traMADol (ULTRAM) 50 MG tablet Take 1 tablet (50 mg total) by mouth every 8 (eight) hours as needed. 11/17/15  Yes Asencion Islamitorya Stover, DPM    ROS:  Out of a complete 14 system review of symptoms, the patient complains only of the following symptoms, and all other reviewed systems are negative.  Runny nose Blurred vision Insomnia Environmental allergies, food allergies Headache  Blood pressure 147/88, pulse 67, height 5\' 6"  (1.676 m), weight 146 lb (66.225 kg).  Physical Exam  General: The patient is alert and cooperative at the time of the examination.  Skin: No significant peripheral edema is noted.   Neurologic Exam  Mental status: The patient is alert and oriented x 3 at the time of the examination. The patient has apparent normal recent and remote memory, with an apparently normal attention span and concentration ability. Mini-Mental Status Examination done today shows a total score of 30/30.   Cranial nerves: Facial symmetry is present. Speech is normal, no aphasia or dysarthria is noted. Extraocular movements are full. Visual fields are full.  Motor: The patient has good strength in all 4 extremities.  Sensory examination: Soft touch sensation is symmetric on the face, arms, and legs.  Coordination: The patient has good finger-nose-finger and heel-to-shin bilaterally.  Gait and station: The patient has a normal gait. Tandem gait is normal. Romberg is negative. No drift is seen.  Reflexes: Deep tendon reflexes are symmetric.   Assessment/Plan:  1. Reported memory  disturbance  2. Migraine headache  The patient will be sent for blood work today looking for an etiology of her memory disturbance. The patient will undergo MRI of the brain, and an EEG study given the history of her seizures previously. The patient will follow-up in 6 months, sooner if needed. We will go over the results of the workup when the results are available.  Cassandra Palau. Keith Bensyn Bornemann MD 03/18/2016 6:22 PM  [facility] [address] [place], [place] [id number]  Phone [phone] Fax [phone]

## 2016-03-19 LAB — VITAMIN B12: Vitamin B-12: 357 pg/mL (ref 211–946)

## 2016-03-19 LAB — RPR: RPR Ser Ql: NONREACTIVE

## 2016-03-19 LAB — TSH: TSH: 7.22 u[IU]/mL — AB (ref 0.450–4.500)

## 2016-03-19 LAB — HIV ANTIBODY (ROUTINE TESTING W REFLEX): HIV SCREEN 4TH GENERATION: NONREACTIVE

## 2016-03-19 LAB — SEDIMENTATION RATE: SED RATE: 9 mm/h (ref 0–32)

## 2016-03-20 ENCOUNTER — Telehealth: Payer: Self-pay | Admitting: Neurology

## 2016-03-20 NOTE — Telephone Encounter (Signed)
I called the patient, left a message. The blood work is unremarkable with exception that the TSH is slightly elevated. I will send the blood work results to the primary care physician. The patient is on thyroid medication.

## 2016-04-11 ENCOUNTER — Ambulatory Visit (INDEPENDENT_AMBULATORY_CARE_PROVIDER_SITE_OTHER): Payer: Medicaid Other

## 2016-04-11 ENCOUNTER — Telehealth: Payer: Self-pay | Admitting: Neurology

## 2016-04-11 DIAGNOSIS — R410 Disorientation, unspecified: Secondary | ICD-10-CM | POA: Diagnosis not present

## 2016-04-11 DIAGNOSIS — G43109 Migraine with aura, not intractable, without status migrainosus: Secondary | ICD-10-CM

## 2016-04-11 DIAGNOSIS — R413 Other amnesia: Secondary | ICD-10-CM

## 2016-04-11 NOTE — Telephone Encounter (Signed)
I called the patient.  The EEG study was normal. 

## 2016-04-11 NOTE — Procedures (Signed)
    History:  Cassandra Zuniga is a 45 year old patient with a history of migraine. The patient has had a new issue with memory, she does not recall conversations that she had with people. She has a history of seizures during her teenage years. The patient has not been on any anticonvulsive medications recently. She is being evaluated for the episodes of memory lapse.  This is a routine EEG. No skull defects are noted. Medications include Proventil, vitamin C, vitamin D, Periactin, Flonase, Synthroid, Naprosyn, Zofran, Maxalt, and Ultram.   EEG classification: Normal awake  Description of the recording: The background rhythms of this recording consists of a fairly well modulated medium amplitude alpha rhythm of 9 Hz that is reactive to eye opening and closure. As the record progresses, the patient appears to remain in the waking state throughout the recording. Photic stimulation was performed, resulting in a bilateral and symmetric photic driving response. Hyperventilation was also performed, resulting in a minimal buildup of the background rhythm activities without significant slowing seen. At no time during the recording does there appear to be evidence of spike or spike wave discharges or evidence of focal slowing. EKG monitor shows no evidence of cardiac rhythm abnormalities with exception of an occasional PVC, with a heart rate of 60.  Impression: This is a normal EEG recording in the waking state. No evidence of ictal or interictal discharges are seen.

## 2016-05-29 ENCOUNTER — Telehealth: Payer: Self-pay | Admitting: Neurology

## 2016-05-29 NOTE — Telephone Encounter (Signed)
Patient called to follow up on scheduling of MRI, states it's been several months and she hasn't heard from anyone. Please call [phone].

## 2016-05-29 NOTE — Telephone Encounter (Signed)
Pt called and given GI phone #

## 2016-05-29 NOTE — Telephone Encounter (Signed)
As stated in the order notes, this [facility] was [facility] to [facility]. Would you mind having the patient call them to schedule Sandi?

## 2016-07-10 ENCOUNTER — Ambulatory Visit: Payer: Medicaid Other | Admitting: Physical Therapy

## 2016-07-18 ENCOUNTER — Ambulatory Visit: Payer: Medicaid Other | Admitting: Physical Therapy

## 2016-09-16 ENCOUNTER — Emergency Department (HOSPITAL_COMMUNITY): Payer: Medicaid Other

## 2016-09-16 ENCOUNTER — Emergency Department (HOSPITAL_COMMUNITY)
Admission: EM | Admit: 2016-09-16 | Discharge: 2016-09-16 | Disposition: A | Discharge status: Home / Self Care | Payer: Medicaid Other | Attending: Emergency Medicine | Admitting: Emergency Medicine

## 2016-09-16 ENCOUNTER — Encounter (HOSPITAL_COMMUNITY): Payer: Self-pay | Admitting: Emergency Medicine

## 2016-09-16 DIAGNOSIS — Y999 Unspecified external cause status: Secondary | ICD-10-CM | POA: Diagnosis not present

## 2016-09-16 DIAGNOSIS — Z87891 Personal history of nicotine dependence: Secondary | ICD-10-CM | POA: Diagnosis not present

## 2016-09-16 DIAGNOSIS — W19XXXA Unspecified fall, initial encounter: Secondary | ICD-10-CM

## 2016-09-16 DIAGNOSIS — S161XXA Strain of muscle, fascia and tendon at neck level, initial encounter: Secondary | ICD-10-CM | POA: Insufficient documentation

## 2016-09-16 DIAGNOSIS — Y9389 Activity, other specified: Secondary | ICD-10-CM | POA: Diagnosis not present

## 2016-09-16 DIAGNOSIS — S199XXA Unspecified injury of neck, initial encounter: Secondary | ICD-10-CM | POA: Diagnosis present

## 2016-09-16 DIAGNOSIS — E039 Hypothyroidism, unspecified: Secondary | ICD-10-CM | POA: Insufficient documentation

## 2016-09-16 DIAGNOSIS — Y92009 Unspecified place in unspecified non-institutional (private) residence as the place of occurrence of the external cause: Secondary | ICD-10-CM | POA: Diagnosis not present

## 2016-09-16 DIAGNOSIS — J45909 Unspecified asthma, uncomplicated: Secondary | ICD-10-CM | POA: Diagnosis not present

## 2016-09-16 DIAGNOSIS — W010XXA Fall on same level from slipping, tripping and stumbling without subsequent striking against object, initial encounter: Secondary | ICD-10-CM | POA: Insufficient documentation

## 2016-09-16 DIAGNOSIS — S0990XA Unspecified injury of head, initial encounter: Secondary | ICD-10-CM | POA: Insufficient documentation

## 2016-09-16 MED ORDER — NAPROXEN 500 MG PO TABS: 500.0000 mg | ORAL_TABLET | Freq: Two times a day (BID) | ORAL | 0 refills | Status: DC

## 2016-09-16 MED ORDER — OXYCODONE-ACETAMINOPHEN 5-325 MG PO TABS: 1.0000 | ORAL_TABLET | ORAL | 0 refills | Status: DC | PRN

## 2016-09-16 MED ORDER — CYCLOBENZAPRINE HCL 10 MG PO TABS: 10.0000 mg | ORAL_TABLET | Freq: Two times a day (BID) | ORAL | 0 refills | Status: DC | PRN

## 2016-09-16 MED ORDER — MORPHINE SULFATE (PF) 4 MG/ML IV SOLN
4.0000 mg | Freq: Once | INTRAVENOUS | Status: AC
  Administered 2016-09-16: 4 mg via INTRAVENOUS
  Filled 2016-09-16: qty 1

## 2016-09-16 MED ORDER — DIPHENHYDRAMINE HCL 50 MG/ML IJ SOLN
25.0000 mg | Freq: Once | INTRAMUSCULAR | Status: AC
  Administered 2016-09-16: 25 mg via INTRAVENOUS
  Filled 2016-09-16: qty 1

## 2016-09-16 NOTE — ED Notes (Signed)
Ambulated 4050ft. Handled well. Complained of small amount of dizziness, believes it was from medicine that she was given.

## 2016-09-16 NOTE — Discharge Instructions (Signed)
CT scans of your head and neck today are normal.  You have suffered a cervical strain.  Take Flexeril, Percocet, and Naproxen for pain.  Ice your neck 10-20 minutes three times daily.  Follow up with your primary care doctor in 1 week.  If your symptoms persist, you may need an outpatient MRI.  You may follow up with neurosurgery, Dr. Franky Machoabbell, for persistent symptoms.  Return to the ED if you develop numbness, weakness, uncontrolled pain, or any new or concerning symptoms.

## 2016-09-16 NOTE — ED Triage Notes (Signed)
Pt in from home via [facility] after playing with son, falling back and hyperextending neck outward, heard "pop". Pt denies LOC, hx of C5-C6 spinal fusion in 06'. Pt c/o 4/10 pressure in C2 region, placed in KED on EMS arrival. +CSM, 132/84, 95%, cbg 86. Pt not on blood thinners

## 2016-09-16 NOTE — ED Provider Notes (Signed)
[facility]-EMERGENCY DEPT Provider Note   CSN: [id number] Arrival date & time: 09/16/16  1229     History   Chief Complaint Chief Complaint  Patient presents with  . Neck Pain  . Head Injury    HPI GRAINNE KNIGHTS is a 46 y.o. female.  HPI JACOBY RITSEMA is a 46 y.o. female with PMH significant for cervical disc fusion in early 2000s in [place] who presents with neck pain s/p fall that occurred just pta.  Patient states she was bear hugging her son when her feet slipped out from under her and she fell, hyperextending her neck and landed on her neck and head.  She states she heard a couple of cracks in her neck.  No LOC.  No N/V, visual disturbance, numbness, weakness, or any other symptoms.  She arrives in c-collar and KED.  She is not on anticoagulants/antiplatelets.  Past Medical History:  Diagnosis Date  . Asthma   . Classic migraine with aura 05/23/2015  . Diverticulitis   . GERD (gastroesophageal reflux disease)   . Hypothyroidism   . Intermittent asthma   . Kidney infection   . Kidney stones   . Migraine   . Osteoporosis   . Scoliosis   . Seasonal allergies   . Seizures (HCC)   . Thyroid disease     Patient Active Problem List   Diagnosis Date Noted  . Memory change 03/18/2016  . Classic migraine with aura 05/23/2015  . Chest pain 04/23/2014  . Migraine 04/23/2014  . Hypothyroidism 04/23/2014  . Bradycardia 04/23/2014  . Intermittent asthma 04/23/2014    Past Surgical History:  Procedure Laterality Date  . APPENDECTOMY    . CERVICAL DISC SURGERY    . COLON SURGERY     Diverticula was removed  . KNEE SURGERY     bilateral  . THYROGLOSSAL DUCT CYST    . TONSILLECTOMY    . TUBAL LIGATION      OB History    No data available       Home Medications    Prior to Admission medications   Medication Sig Start Date End Date Taking? Authorizing Provider  albuterol (PROVENTIL HFA;VENTOLIN HFA) 108 (90 BASE) MCG/ACT inhaler Inhale 2 puffs into the lungs  every 6 (six) hours as needed for wheezing or shortness of breath.    Historical Provider, MD  Ascorbic Acid (VITAMIN C PO) Take 1 tablet by mouth daily.    Historical Provider, MD  Cholecalciferol (VITAMIN D PO) Take 1 tablet by mouth daily.    Historical Provider, MD  cyclobenzaprine (FLEXERIL) 10 MG tablet Take 1 tablet (10 mg total) by mouth 2 (two) times daily as needed for muscle spasms. 09/16/16   Cheri Fowler, PA-C  cyproheptadine (PERIACTIN) 4 MG tablet Take 1 tablet (4 mg total) by mouth 3 (three) times daily as needed for allergies. 04/21/15   Lissa Hoard, PA-C  Dermatological Products, Misc. (NUVAIL) SOLN Apply 1 drop topically daily. 01/30/16   Titorya Stover, DPM  fluticasone (FLONASE) 50 MCG/ACT nasal spray SHAKE LQ AND U 2 SPRAYS NASALLY QD UTD 10/18/15   Historical Provider, MD  levothyroxine (SYNTHROID, LEVOTHROID) 100 MCG tablet TK 1 T PO QAM 10/11/15   Historical Provider, MD  naproxen (NAPROSYN) 500 MG tablet Take 1 tablet (500 mg total) by mouth 2 (two) times daily. 09/16/16   Zaccary Creech, PA-C  ondansetron (ZOFRAN ODT) 4 MG disintegrating tablet Take 1 tablet (4 mg total) by mouth every 8 (eight) hours  as needed for nausea or vomiting. 08/17/15   Minna Antis, MD  oxyCODONE-acetaminophen (PERCOCET/ROXICET) 5-325 MG tablet Take 1 tablet by mouth every 4 (four) hours as needed for severe pain. 09/16/16   Cheri Fowler, PA-C  rizatriptan (MAXALT-MLT) 10 MG disintegrating tablet  08/23/15   Historical Provider, MD  terbinafine (LAMISIL) 250 MG tablet Take 1 tablet (250 mg total) by mouth daily. 03/13/16   Asencion Islam, DPM  traMADol (ULTRAM) 50 MG tablet Take 1 tablet (50 mg total) by mouth every 8 (eight) hours as needed. 11/17/15   Asencion Islam, DPM    Family History Family History  Problem Relation Age of Onset  . GI Disease Mother     Gastroparesis  . Hypertension Mother   . Migraines Mother   . Hypothyroidism Mother   . Migraines Sister   . Diabetes Other   . Cancer  Other   . Angina Paternal Grandfather   . Diabetes Paternal Grandfather   . Stroke Paternal Grandfather   . Cancer Maternal Aunt   . Cancer Maternal Uncle   . Stroke Maternal Grandmother   . Hypertension Maternal Grandfather   . Diabetes Paternal Grandmother   . Stroke Paternal Grandmother   . Hypothyroidism Son     Social History Social History  Substance Use Topics  . Smoking status: Former Smoker    Quit date: 07/17/1989  . Smokeless tobacco: Never Used  . Alcohol use 0.0 oz/week     Comment: very rarely     Allergies   Erythromycin; Erythromycin; Percocet [oxycodone-acetaminophen]; Phenergan [promethazine hcl]; Sulfa antibiotics; Sulfa antibiotics; and Phenergan [promethazine hcl]   Review of Systems Review of Systems All other systems negative unless otherwise stated in HPI   Physical Exam Updated Vital Signs BP 128/72   Pulse (!) 53   Ht 5\' 6"  (1.676 m)   Wt 57.6 kg   SpO2 96%   BMI 20.50 kg/m   Physical Exam  Constitutional: She is oriented to person, place, and time. She appears well-developed and well-nourished.  Non-toxic appearance. She does not have a sickly appearance. She does not appear ill.  C-collar  HENT:  Head: Normocephalic and atraumatic.  Mouth/Throat: Oropharynx is clear and moist.  Eyes: Conjunctivae are normal. Pupils are equal, round, and reactive to light.  Neck: Normal range of motion. Neck supple.  Cervical midline tenderness.   Cardiovascular: Normal rate and regular rhythm.   Pulmonary/Chest: Effort normal and breath sounds normal. No accessory muscle usage or stridor. No respiratory distress. She has no wheezes. She has no rhonchi. She has no rales.  Abdominal: Soft. Bowel sounds are normal. She exhibits no distension. There is no tenderness.  Musculoskeletal: Normal range of motion.  No t/l spine tenderness.   Lymphadenopathy:    She has no cervical adenopathy.  Neurological: She is alert and oriented to person, place, and  time.  Mental Status:   AOx3.  Speech clear without dysarthria. Cranial Nerves:  I-not tested  II-PERRLA  III, IV, VI-EOMs intact  V-temporal and masseter strength intact  VII-symmetrical facial movements intact, no facial droop  VIII-hearing grossly intact bilaterally  IX, X-gag intact  XI-strength of sternomastoid and trapezius muscles 5/5  XII-tongue midline Motor:   Good muscle bulk and tone  Strength 5/5 bilaterally in upper and lower extremities   Cerebellar--intact RAMs, finger to nose intact bilaterally.  Gait normal  No pronator drift Sensory:  Intact in upper and lower extremities   Skin: Skin is warm and dry.  Psychiatric: She  has a normal mood and affect. Her behavior is normal.     ED Treatments / Results  Labs (all labs ordered are listed, but only abnormal results are displayed) Labs Reviewed - No data to display  EKG  EKG Interpretation None       Radiology Ct Head Wo Contrast  Result Date: 09/16/2016 CLINICAL DATA:  Patient with trauma to the head.  Neck pain. EXAM: CT HEAD WITHOUT CONTRAST CT CERVICAL SPINE WITHOUT CONTRAST TECHNIQUE: Multidetector CT imaging of the head and cervical spine was performed following the standard protocol without intravenous contrast. Multiplanar CT image reconstructions of the cervical spine were also generated. COMPARISON:  None. FINDINGS: CT HEAD FINDINGS Brain: Ventricles and sulci are appropriate for patient's age. No evidence for acute cortically based infarct, intracranial hemorrhage, mass lesion or mass-effect. Vascular: Unremarkable. Skull: Intact.  No displaced skull fracture. Sinuses/Orbits: Paranasal sinuses are well aerated. Mastoid air cells unremarkable. Orbits are unremarkable. Other: None. CT CERVICAL SPINE FINDINGS Alignment: Normal anatomic alignment. Skull base and vertebrae: Intact. Soft tissues and spinal canal: Unremarkable. Disc levels: Anterior cervical spinal fusion C4-C6 with intervertebral body fusion.  No evidence for acute fracture. Upper chest: Not visualized. Other: None. IMPRESSION: No acute intracranial process. No acute cervical spine fracture. Electronically Signed   By: Annia Beltrew  Davis M.D.   On: 09/16/2016 14:06   Ct Cervical Spine Wo Contrast  Result Date: 09/16/2016 CLINICAL DATA:  Patient with trauma to the head.  Neck pain. EXAM: CT HEAD WITHOUT CONTRAST CT CERVICAL SPINE WITHOUT CONTRAST TECHNIQUE: Multidetector CT imaging of the head and cervical spine was performed following the standard protocol without intravenous contrast. Multiplanar CT image reconstructions of the cervical spine were also generated. COMPARISON:  None. FINDINGS: CT HEAD FINDINGS Brain: Ventricles and sulci are appropriate for patient's age. No evidence for acute cortically based infarct, intracranial hemorrhage, mass lesion or mass-effect. Vascular: Unremarkable. Skull: Intact.  No displaced skull fracture. Sinuses/Orbits: Paranasal sinuses are well aerated. Mastoid air cells unremarkable. Orbits are unremarkable. Other: None. CT CERVICAL SPINE FINDINGS Alignment: Normal anatomic alignment. Skull base and vertebrae: Intact. Soft tissues and spinal canal: Unremarkable. Disc levels: Anterior cervical spinal fusion C4-C6 with intervertebral body fusion. No evidence for acute fracture. Upper chest: Not visualized. Other: None. IMPRESSION: No acute intracranial process. No acute cervical spine fracture. Electronically Signed   By: Annia Beltrew  Davis M.D.   On: 09/16/2016 14:06    Procedures Procedures (including critical care time)  Medications Ordered in ED Medications  morphine 4 MG/ML injection 4 mg (4 mg Intravenous Given 09/16/16 1322)  diphenhydrAMINE (BENADRYL) injection 25 mg (25 mg Intravenous Given 09/16/16 1322)     Initial Impression / Assessment and Plan / ED Course  I have reviewed the triage vital signs and the nursing notes.  Pertinent labs & imaging results that were available during my care of the patient were  reviewed by me and considered in my medical decision making (see chart for details).  Clinical Course    Patient presents with mechanical fall now with neck pain with hx of cervical disc fusion.  No neurologic complaints.  No focal neurological deficits on exam.  CT head and cervical spine unremarkable.  She is able to ambulate without difficulty.  Discharge home with naproxen, flexeril, and short course percocet.  She will follow up with her PCP.  Patient also given follow up with neurosurgery should her symptoms persist and discussed possibility of outpatient MRI.  Return precautions discussed.  Stable for discharge.  Case  has been discussed with Dr. Fredderick Phenix who agrees with the above plan for discharge.    Final Clinical Impressions(s) / ED Diagnoses   Final diagnoses:  Fall, initial encounter  Strain of neck muscle, initial encounter    New Prescriptions New Prescriptions   CYCLOBENZAPRINE (FLEXERIL) 10 MG TABLET    Take 1 tablet (10 mg total) by mouth 2 (two) times daily as needed for muscle spasms.   NAPROXEN (NAPROSYN) 500 MG TABLET    Take 1 tablet (500 mg total) by mouth 2 (two) times daily.   OXYCODONE-ACETAMINOPHEN (PERCOCET/ROXICET) 5-325 MG TABLET    Take 1 tablet by mouth every 4 (four) hours as needed for severe pain.     Cheri Fowler, PA-C 09/16/16 1519    Rolan Bucco, MD 09/16/16 [phone]

## 2016-09-19 ENCOUNTER — Ambulatory Visit: Payer: Medicaid Other | Admitting: Adult Health

## 2016-09-24 ENCOUNTER — Encounter: Payer: Self-pay | Admitting: Adult Health

## 2016-09-24 ENCOUNTER — Ambulatory Visit (INDEPENDENT_AMBULATORY_CARE_PROVIDER_SITE_OTHER): Payer: Medicaid Other | Admitting: Adult Health

## 2016-09-24 VITALS — BP 128/78 | HR 64 | Ht 66.0 in | Wt 133.6 lb

## 2016-09-24 DIAGNOSIS — G43009 Migraine without aura, not intractable, without status migrainosus: Secondary | ICD-10-CM

## 2016-09-24 DIAGNOSIS — R413 Other amnesia: Secondary | ICD-10-CM

## 2016-09-24 NOTE — Patient Instructions (Signed)
Call [facility] to [facility]  [phone] If scan is normal we will proceed with neuropsychological testing.  If your symptoms worsen or you develop new symptoms please let us know.

## 2016-09-24 NOTE — Progress Notes (Signed)
PATIENT: Cassandra Zuniga DOB: 1971-06-30  REASON FOR VISIT: follow up HISTORY FROM: patient  HISTORY OF PRESENT ILLNESS: Cassandra Zuniga is a 46 year old female with a history of migraine headaches and memory disturbance. She returns today for follow-up. The patient reports that she has approximately 1-2 headaches a month. Her headaches normally resolve with Maxalt. The patient continues to report trouble with her memory. She is able to complete all ADLs independent. She operates a Librarian, academic without difficulty. She is able to complete all of her finances. She prepares all of her meals. She reports occasionally she has a hard time falling asleep or will wake up during the night. The patient also reports that she has anxiety. In the past she was given  Lorazepam by the emergency room for anxiety attacks. She states that she notices that she has a hard time remembering short and long-term events. She states that she was filling out her medical history and had difficulty remembering what to put on there. She returns today for an evaluation.  HISTORY 03/18/16: Cassandra Zuniga is a 46 year old right-handed white female with a history of migraine headache. The patient indicates that her headaches are occurring with the usual frequency, they are generally activated by weather changes. The patient will take Maxalt or Excedrin Migraine which seems to help. Within the last several months, the patient has noted a new problem associated with a disturbance with memory. The patient is not recalling conversations that she has had with people, she is having difficulty retaining new information. She is not sleeping well at night because of hot flashes, but she denies that she is fatigued or sleepy during the day. She does not relate the troubles with memory and concentration to the time around her migraine. She does have a history of seizures as an adolescent. She has not been on any anticonvulsant medications recently.  She denies any blackout episodes or sudden changes in lapses of memory. She comes to this office for an evaluation.  REVIEW OF SYSTEMS: Out of a complete 14 system review of symptoms, the patient complains only of the following symptoms, and all other reviewed systems are negative.  Blurred vision, ringing in ears  ALLERGIES: Allergies  Allergen Reactions  . Erythromycin Hives  . Erythromycin Hives  . Percocet [Oxycodone-Acetaminophen] Itching  . Phenergan [Promethazine Hcl] Other (See Comments)    Red lines up arm  . Sulfa Antibiotics Nausea And Vomiting  . Sulfa Antibiotics Nausea And Vomiting  . Phenergan [Promethazine Hcl] Rash    HOME MEDICATIONS: Outpatient Medications Prior to Visit  Medication Sig Dispense Refill  . albuterol (PROVENTIL HFA;VENTOLIN HFA) 108 (90 BASE) MCG/ACT inhaler Inhale 2 puffs into the lungs every 6 (six) hours as needed for wheezing or shortness of breath.    . cyclobenzaprine (FLEXERIL) 10 MG tablet Take 1 tablet (10 mg total) by mouth 2 (two) times daily as needed for muscle spasms. 20 tablet 0  . cyproheptadine (PERIACTIN) 4 MG tablet Take 1 tablet (4 mg total) by mouth 3 (three) times daily as needed for allergies. 30 tablet 0  . fluticasone (FLONASE) 50 MCG/ACT nasal spray SHAKE LQ AND U 2 SPRAYS NASALLY QD UTD  5  . naproxen (NAPROSYN) 500 MG tablet Take 1 tablet (500 mg total) by mouth 2 (two) times daily. 30 tablet 0  . ondansetron (ZOFRAN ODT) 4 MG disintegrating tablet Take 1 tablet (4 mg total) by mouth every 8 (eight) hours as needed for nausea or vomiting. 20 tablet  0  . rizatriptan (MAXALT-MLT) 10 MG disintegrating tablet   3  . traMADol (ULTRAM) 50 MG tablet Take 1 tablet (50 mg total) by mouth every 8 (eight) hours as needed. 30 tablet 0  . levothyroxine (SYNTHROID, LEVOTHROID) 100 MCG tablet TK 1 T PO QAM  5  . terbinafine (LAMISIL) 250 MG tablet Take 1 tablet (250 mg total) by mouth daily. 30 tablet 2  . Ascorbic Acid (VITAMIN C PO)  Take 1 tablet by mouth daily.    . Cholecalciferol (VITAMIN D PO) Take 1 tablet by mouth daily.    . Dermatological Products, Misc. (NUVAIL) SOLN Apply 1 drop topically daily. (Patient not taking: Reported on 09/24/2016) 1 Bottle 11  . oxyCODONE-acetaminophen (PERCOCET/ROXICET) 5-325 MG tablet Take 1 tablet by mouth every 4 (four) hours as needed for severe pain. (Patient not taking: Reported on 09/24/2016) 10 tablet 0   Facility-Administered Medications Prior to Visit  Medication Dose Route Frequency Provider Last Rate Last Dose  . triamcinolone acetonide (KENALOG) 10 MG/ML injection 10 mg  10 mg Other Once Asencion Islam, DPM        PAST MEDICAL HISTORY: Past Medical History:  Diagnosis Date  . Asthma   . Classic migraine with aura 05/23/2015  . Diverticulitis   . GERD (gastroesophageal reflux disease)   . Hypothyroidism   . Intermittent asthma   . Kidney infection   . Kidney stones   . Migraine   . Osteoporosis   . Scoliosis   . Seasonal allergies   . Seizures (HCC)   . Thyroid disease     PAST SURGICAL HISTORY: Past Surgical History:  Procedure Laterality Date  . APPENDECTOMY    . CERVICAL DISC SURGERY    . COLON SURGERY     Diverticula was removed  . KNEE SURGERY     bilateral  . THYROGLOSSAL DUCT CYST    . TONSILLECTOMY    . TUBAL LIGATION      FAMILY HISTORY: Family History  Problem Relation Age of Onset  . GI Disease Mother     Gastroparesis  . Hypertension Mother   . Migraines Mother   . Hypothyroidism Mother   . Migraines Sister   . Diabetes Other   . Cancer Other   . Angina Paternal Grandfather   . Diabetes Paternal Grandfather   . Stroke Paternal Grandfather   . Cancer Maternal Aunt   . Cancer Maternal Uncle   . Stroke Maternal Grandmother   . Hypertension Maternal Grandfather   . Diabetes Paternal Grandmother   . Stroke Paternal Grandmother   . Hypothyroidism Son     SOCIAL HISTORY: Social History   Social History  . Marital status: Single      Spouse name: N/A  . Number of children: 2  . Years of education: assoc.   Occupational History  . Not on file.   Social History Main Topics  . Smoking status: Former Smoker    Quit date: 07/17/1989  . Smokeless tobacco: Never Used  . Alcohol use 0.0 oz/week     Comment: very rarely  . Drug use: No  . Sexual activity: Yes    Birth control/ protection: Surgical   Other Topics Concern  . Not on file   Social History Narrative   ** Merged History Encounter **   Lives at home w/ her boyfriend and 2 children   Patient drinks about 1-2 cups of caffeine daily.   Patient is right handed.      PHYSICAL EXAM  Vitals:   09/24/16 1113  BP: 128/78  Pulse: 64  Weight: 133 lb 9.6 oz (60.6 kg)  Height: 5\' 6"  (1.676 m)   Body mass index is 21.56 kg/m.  Generalized: Well developed, in no acute distress   Neurological examination  Mentation: Alert oriented to time, place, history taking. Follows all commands speech and language fluent Cranial nerve II-XII: Pupils were equal round reactive to light. Extraocular movements were full, visual field were full on confrontational test. Facial sensation and strength were normal. Uvula tongue midline. Head turning and shoulder shrug  were normal and symmetric. Motor: The motor testing reveals 5 over 5 strength of all 4 extremities. Good symmetric motor tone is noted throughout.  Sensory: Sensory testing is intact to soft touch on all 4 extremities. No evidence of extinction is noted.  Coordination: Cerebellar testing reveals good finger-nose-finger and heel-to-shin bilaterally.  Gait and station: Gait is normal. Tandem gait is normal. Romberg is negative. No drift is seen.  Reflexes: Deep tendon reflexes are symmetric and normal bilaterally.   DIAGNOSTIC DATA (LABS, IMAGING, TESTING) - I reviewed patient records, labs, notes, testing and imaging myself where available.  Lab Results  Component Value Date   WBC 5.9 01/30/2016   HGB 12.8  08/17/2015   HCT 40.8 01/30/2016   MCV 87 01/30/2016   PLT 277 01/30/2016      Component Value Date/Time   NA 140 08/17/2015 0132   NA 141 12/24/2013 0008   K 3.9 08/17/2015 0132   K 4.0 12/24/2013 0008   CL 107 08/17/2015 0132   CL 106 12/24/2013 0008   CO2 26 08/17/2015 0132   CO2 29 12/24/2013 0008   GLUCOSE 116 (H) 08/17/2015 0132   GLUCOSE 91 12/24/2013 0008   BUN 8 08/17/2015 0132   BUN 11 12/24/2013 0008   CREATININE 0.56 08/17/2015 0132   CREATININE 0.77 12/24/2013 0008   CALCIUM 9.1 08/17/2015 0132   CALCIUM 9.3 12/24/2013 0008   PROT 7.1 03/12/2016 0855   PROT 8.1 12/24/2013 0008   ALBUMIN 4.5 03/12/2016 0855   ALBUMIN 4.3 12/24/2013 0008   AST 16 03/12/2016 0855   AST 22 12/24/2013 0008   ALT 16 03/12/2016 0855   ALT 21 12/24/2013 0008   ALKPHOS 65 03/12/2016 0855   ALKPHOS 65 12/24/2013 0008   BILITOT <0.2 03/12/2016 0855   BILITOT 0.2 12/24/2013 0008   GFRNONAA >60 08/17/2015 0132   GFRNONAA >60 12/24/2013 0008   GFRAA >60 08/17/2015 0132   GFRAA >60 12/24/2013 0008       ASSESSMENT AND PLAN 46 y.o. year old female  has a past medical history of Asthma; Classic migraine with aura (05/23/2015); Diverticulitis; GERD (gastroesophageal reflux disease); Hypothyroidism; Intermittent asthma; Kidney infection; Kidney stones; Migraine; Osteoporosis; Scoliosis; Seasonal allergies; Seizures (HCC); and Thyroid disease. here with:  1. Memory disturbance 2. Migraine headaches  The patient's workup this far has been normal. EEG was unremarkable as well as blood work. The patient has not yet had her MRI of the brain. I have encouraged the patient to call [facility] to schedule this. If her MRI is normal we will consider sending the patient for neuropsychological testing in regards to her memory. The patient will continue using Maxalt for her headaches. Advised that if her symptoms worsen or she develops new symptoms she should let us know. Follow-up in 6 months or  sooner if needed.     Butch PennyMegan Heywood Tokunaga, MSN, NP-C 09/24/2016, 11:23 AM [facility] [address],  [place], [place] [address] [phone]

## 2016-09-24 NOTE — Progress Notes (Signed)
I have read the note, and I agree with the clinical assessment and plan.  Rune Mendez KEITH   

## 2016-12-01 ENCOUNTER — Emergency Department
Admission: EM | Admit: 2016-12-01 | Discharge: 2016-12-01 | Disposition: A | Discharge status: Home / Self Care | Payer: Medicaid Other | Attending: Emergency Medicine | Admitting: Emergency Medicine

## 2016-12-01 DIAGNOSIS — Z87891 Personal history of nicotine dependence: Secondary | ICD-10-CM | POA: Diagnosis not present

## 2016-12-01 DIAGNOSIS — J45909 Unspecified asthma, uncomplicated: Secondary | ICD-10-CM | POA: Diagnosis not present

## 2016-12-01 DIAGNOSIS — H66002 Acute suppurative otitis media without spontaneous rupture of ear drum, left ear: Secondary | ICD-10-CM | POA: Insufficient documentation

## 2016-12-01 DIAGNOSIS — H9202 Otalgia, left ear: Secondary | ICD-10-CM | POA: Diagnosis present

## 2016-12-01 DIAGNOSIS — E039 Hypothyroidism, unspecified: Secondary | ICD-10-CM | POA: Insufficient documentation

## 2016-12-01 MED ORDER — AMOXICILLIN 500 MG PO CAPS: 500.0000 mg | ORAL_CAPSULE | Freq: Two times a day (BID) | ORAL | 0 refills | Status: AC

## 2016-12-01 MED ORDER — AMOXICILLIN 500 MG PO CAPS
500.0000 mg | ORAL_CAPSULE | Freq: Once | ORAL | Status: AC
  Administered 2016-12-01: 500 mg via ORAL
  Filled 2016-12-01: qty 1

## 2016-12-01 MED ORDER — NAPROXEN 500 MG PO TBEC: 500.0000 mg | DELAYED_RELEASE_TABLET | Freq: Two times a day (BID) | ORAL | 0 refills | Status: AC

## 2016-12-01 MED ORDER — NAPROXEN 500 MG PO TABS
500.0000 mg | ORAL_TABLET | Freq: Once | ORAL | Status: AC
  Administered 2016-12-01: 500 mg via ORAL
  Filled 2016-12-01: qty 1

## 2016-12-01 NOTE — ED Triage Notes (Signed)
Pt diagnosed with flu on Thursday; taking tamiflu for same; pt says last night she started having pain and pressure in her left ear; pt awake and alert, talking in complete coherent sentences

## 2016-12-01 NOTE — ED Provider Notes (Signed)
[facility] Emergency Department Provider Note  ____________________________________________  Time seen: Approximately 8:39 PM  I have reviewed the triage vital signs and the nursing notes.   HISTORY  Chief Complaint Otalgia   HPI Cassandra Zuniga is a 46 y.o. female presenting to the emergency department with left otalgia for the past 4 days. Patient denies hearing loss or ear discharge.  Patient states that she was diagnosed with influenza 4 days ago by her primary care provider. Patient denies rhinorrhea, congestion, headache, myalgias, emesis and diarrhea. Patient states "just my ear hurts". Patient states that she had recurrent middle ear infections as a child. Patient has had fever and chills. Patient has been taking Tamiflu but no other alleviating measures.   Past Medical History:  Diagnosis Date  . Asthma   . Classic migraine with aura 05/23/2015  . Diverticulitis   . GERD (gastroesophageal reflux disease)   . Hypothyroidism   . Intermittent asthma   . Kidney infection   . Kidney stones   . Migraine   . Osteoporosis   . Scoliosis   . Seasonal allergies   . Seizures (HCC)   . Thyroid disease     Patient Active Problem List   Diagnosis Date Noted  . Memory change 03/18/2016  . Classic migraine with aura 05/23/2015  . Chest pain 04/23/2014  . Migraine 04/23/2014  . Hypothyroidism 04/23/2014  . Bradycardia 04/23/2014  . Intermittent asthma 04/23/2014    Past Surgical History:  Procedure Laterality Date  . APPENDECTOMY    . CERVICAL DISC SURGERY    . COLON SURGERY     Diverticula was removed  . KNEE SURGERY     bilateral  . THYROGLOSSAL DUCT CYST    . TONSILLECTOMY    . TUBAL LIGATION      Prior to Admission medications   Medication Sig Start Date End Date Taking? Authorizing Provider  albuterol (PROVENTIL HFA;VENTOLIN HFA) 108 (90 BASE) MCG/ACT inhaler Inhale 2 puffs into the lungs every 6 (six) hours as needed for wheezing or  shortness of breath.    Historical Provider, MD  amoxicillin (AMOXIL) 500 MG capsule Take 1 capsule (500 mg total) by mouth 2 (two) times daily. 12/01/16 12/11/16  Orvil Feil, PA-C  Ascorbic Acid (VITAMIN C PO) Take 1 tablet by mouth daily.    Historical Provider, MD  Cholecalciferol (VITAMIN D PO) Take 1 tablet by mouth daily.    Historical Provider, MD  cyclobenzaprine (FLEXERIL) 10 MG tablet Take 1 tablet (10 mg total) by mouth 2 (two) times daily as needed for muscle spasms. 09/16/16   Cheri Fowler, PA-C  cyproheptadine (PERIACTIN) 4 MG tablet Take 1 tablet (4 mg total) by mouth 3 (three) times daily as needed for allergies. 04/21/15   Jenise V Bacon Menshew, PA-C  fluticasone (FLONASE) 50 MCG/ACT nasal spray SHAKE LQ AND U 2 SPRAYS NASALLY QD UTD 10/18/15   Historical Provider, MD  levothyroxine (SYNTHROID, LEVOTHROID) 112 MCG tablet Take 112 mcg by mouth daily before breakfast.    Historical Provider, MD  LORazepam (ATIVAN) 0.5 MG tablet Take 0.5 mg by mouth every 8 (eight) hours as needed for anxiety.    Historical Provider, MD  Multiple Vitamin (MULTIVITAMIN) tablet Take 1 tablet by mouth 2 (two) times daily.    Historical Provider, MD  naproxen (EC NAPROSYN) 500 MG EC tablet Take 1 tablet (500 mg total) by mouth 2 (two) times daily with a meal. 12/01/16 12/11/16  Orvil Feil, PA-C  ondansetron (  ZOFRAN ODT) 4 MG disintegrating tablet Take 1 tablet (4 mg total) by mouth every 8 (eight) hours as needed for nausea or vomiting. 08/17/15   Minna AntisKevin Paduchowski, MD  rizatriptan (MAXALT-MLT) 10 MG disintegrating tablet  08/23/15   Historical Provider, MD  traMADol (ULTRAM) 50 MG tablet Take 1 tablet (50 mg total) by mouth every 8 (eight) hours as needed. 11/17/15   Asencion Islamitorya Stover, DPM    Allergies Erythromycin; Erythromycin; Percocet [oxycodone-acetaminophen]; Phenergan [promethazine hcl]; Sulfa antibiotics; Sulfa antibiotics; and Phenergan [promethazine hcl]  Family History  Problem Relation Age of Onset   . GI Disease Mother     Gastroparesis  . Hypertension Mother   . Migraines Mother   . Hypothyroidism Mother   . Migraines Sister   . Diabetes Other   . Cancer Other   . Angina Paternal Grandfather   . Diabetes Paternal Grandfather   . Stroke Paternal Grandfather   . Cancer Maternal Aunt   . Cancer Maternal Uncle   . Stroke Maternal Grandmother   . Hypertension Maternal Grandfather   . Diabetes Paternal Grandmother   . Stroke Paternal Grandmother   . Hypothyroidism Son     Social History Social History  Substance Use Topics  . Smoking status: Former Smoker    Quit date: 07/17/1989  . Smokeless tobacco: Never Used  . Alcohol use 0.0 oz/week     Comment: very rarely    Review of Systems  Constitutional: Patient has had fever.  Eyes: No visual changes. No discharge ENT: Patient has left otalgia. Cardiovascular: no chest pain. Respiratory: no cough. No SOB. Gastrointestinal: No abdominal pain. No nausea, no vomiting. No diarrhea.  No constipation. Musculoskeletal: Negative for musculoskeletal pain. Skin: Negative for rash, abrasions, lacerations, ecchymosis. Neurological: Negative for headaches, focal weakness or numbness. ____________________________________________   PHYSICAL EXAM:  VITAL SIGNS: ED Triage Vitals  Enc Vitals Group     BP 12/01/16 1948 (!) 141/86     Pulse Rate 12/01/16 1948 85     Resp 12/01/16 1948 18     Temp 12/01/16 1948 98.5 F (36.9 C)     Temp Source 12/01/16 1948 Oral     SpO2 12/01/16 1948 97 %     Weight 12/01/16 1949 128 lb (58.1 kg)     Height 12/01/16 1949 5\' 6"  (1.676 m)     Head Circumference --      Peak Flow --      Pain Score 12/01/16 1956 9     Pain Loc --      Pain Edu? --      Excl. in GC? --     Constitutional: Alert and oriented. Well appearing and in no acute distress. Eyes: Conjunctivae are normal. PERRL. EOMI. Head: Atraumatic. ENT:      Ears: Right tympanic membrane is pearly with effusion. Left tympanic  membrane is erythematous with evidence of purulent exudate visualized.      Nose: No congestion/rhinnorhea.      Mouth/Throat: Mucous membranes are moist.  Neck: FROM.  Hematological/Lymphatic/Immunilogical: No cervical lymphadenopathy. Cardiovascular: Normal rate, regular rhythm. Normal S1 and S2.  Good peripheral circulation. Respiratory: Normal respiratory effort without tachypnea or retractions. Lungs CTAB. Good air entry to the bases with no decreased or absent breath sounds. Skin:  Skin is warm, dry and intact. No rash noted. Psychiatric: Mood and affect are normal. Speech and behavior are normal. Patient exhibits appropriate insight and judgement. ____________________________________________   LABS (all labs ordered are listed, but only abnormal results are  displayed)  Labs Reviewed - No data to display ____________________________________________  EKG   ____________________________________________  RADIOLOGY   No results found.  ____________________________________________    PROCEDURES  Procedure(s) performed:    Procedures    Medications  amoxicillin (AMOXIL) capsule 500 mg (not administered)  naproxen (NAPROSYN) tablet 500 mg (not administered)     ____________________________________________   INITIAL IMPRESSION / ASSESSMENT AND PLAN / ED COURSE  Pertinent labs & imaging results that were available during my care of the patient were reviewed by me and considered in my medical decision making (see chart for details).  Review of the [place] CSRS was performed in accordance of the [facility] prior to dispensing any controlled drugs.     Assessment and Plan:  Left Otitis Media: Patient presents to the emergency department with left otalgia and fever for the past four days. On physical exam, left tympanic membrane is erythematous with evidence of purulent exudate visualized. Patient was discharged with amoxicillin and naproxen. Vital signs are reassuring at  this time. All patient questions were answered. ____________________________________________  FINAL CLINICAL IMPRESSION(S) / ED DIAGNOSES  Final diagnoses:  Acute suppurative otitis media of left ear without spontaneous rupture of tympanic membrane, recurrence not specified      NEW MEDICATIONS STARTED DURING THIS VISIT:  New Prescriptions   AMOXICILLIN (AMOXIL) 500 MG CAPSULE    Take 1 capsule (500 mg total) by mouth 2 (two) times daily.   NAPROXEN (EC NAPROSYN) 500 MG EC TABLET    Take 1 tablet (500 mg total) by mouth 2 (two) times daily with a meal.        This chart was dictated using voice recognition software/Dragon. Despite best efforts to proofread, errors can occur which can change the meaning. Any change was purely unintentional.    Orvil Feil, PA-C 12/01/16 2100    Loleta Rose, MD 12/02/16 [phone]

## 2017-03-20 ENCOUNTER — Telehealth: Payer: Self-pay | Admitting: *Deleted

## 2017-03-20 ENCOUNTER — Other Ambulatory Visit: Payer: Self-pay | Admitting: *Deleted

## 2017-03-20 DIAGNOSIS — G43109 Migraine with aura, not intractable, without status migrainosus: Secondary | ICD-10-CM

## 2017-03-20 DIAGNOSIS — R413 Other amnesia: Secondary | ICD-10-CM

## 2017-03-20 NOTE — Telephone Encounter (Signed)
Updated order placed (as radiology facility required new order).

## 2017-03-24 ENCOUNTER — Ambulatory Visit: Payer: Medicaid Other | Admitting: Adult Health

## 2017-03-28 ENCOUNTER — Other Ambulatory Visit: Payer: Self-pay | Admitting: Adult Health

## 2017-04-01 ENCOUNTER — Telehealth: Payer: Self-pay | Admitting: Adult Health

## 2017-04-01 NOTE — Telephone Encounter (Signed)
Spoke with patient and advised her of Emily's message earlier. Patient had not heard her voicemail. She scheduled her FU with NP for 04/09/17, and stated she would [facility] and cancel MRI for 04/03/17. She verbalized understanding, appreciation of call.

## 2017-04-01 NOTE — Telephone Encounter (Signed)
I left a voicemail on Cassandra Zuniga phone message at [facility] to inform her that the MIR needs to be cancel and that she will be seen in the office first before having the mri. Per the insurance.

## 2017-04-01 NOTE — Telephone Encounter (Signed)
I called the patient's insurance. Her insurance is requiring an office visit within the last 60 days. That was the reason for the denial. We will need to cancel her MRI on Thursday. We will call the patient to get her scheduled for an appointment.

## 2017-04-01 NOTE — Telephone Encounter (Signed)
Noelia with [facility] emailed me and informed me that Medicaid did not approve the MRI. The phone number for the peer to peer is [phone] and the case number is [id number]. She is scheduled to have this MRI on Thursday 04/03/17.

## 2017-04-03 ENCOUNTER — Other Ambulatory Visit: Payer: Medicaid Other

## 2017-04-09 ENCOUNTER — Ambulatory Visit (INDEPENDENT_AMBULATORY_CARE_PROVIDER_SITE_OTHER): Payer: Medicaid Other | Admitting: Adult Health

## 2017-04-09 ENCOUNTER — Encounter: Payer: Self-pay | Admitting: Adult Health

## 2017-04-09 VITALS — BP 114/73 | HR 75 | Ht 66.0 in | Wt 133.0 lb

## 2017-04-09 DIAGNOSIS — G43009 Migraine without aura, not intractable, without status migrainosus: Secondary | ICD-10-CM

## 2017-04-09 DIAGNOSIS — R413 Other amnesia: Secondary | ICD-10-CM | POA: Diagnosis not present

## 2017-04-09 NOTE — Progress Notes (Signed)
I have read the note, and I agree with the clinical assessment and plan.  Cassandra Zuniga,Cassandra Zuniga   

## 2017-04-09 NOTE — Progress Notes (Signed)
PATIENT: Cassandra BrunnerJulia A Zuniga DOB: [birth year]  REASON FOR VISIT: follow up- migraine headaches, memory disturbance HISTORY FROM: patient  HISTORY OF PRESENT ILLNESS: Cassandra Zuniga is a 46 year old female with a history of migraine headaches and memory disturbance. She returns today for follow-up. She states that her headaches are under relatively good control. She's not on a daily preventative medication. She states that she normally can take Excedrin Migraine or Maxalt and this resolves her headaches. She states that her memory has actually gotten better. She states that she has noticed that if she is under a lot of stress she begins to have more symptoms. She states that she primarily notices that she forgets conversations at times. She lives at home with her boyfriend and 2 kids. She is able to complete all ADLs independently. She operates a Librarian, academicmotor vehicle without difficulty. She is able to in between her finances and do all meal preparation without difficulty. She states that she is not sleeping well. Reports that she is having night sweats due to menopause. She reports that after having a migraine she does feel like she is "foggy headed." She states that sometimes this can last more than a day. She returns today for an evaluation.  HISTORY Cassandra Zuniga is a 46 year old female with a history of migraine headaches and memory disturbance. She returns today for follow-up. The patient reports that she has approximately 1-2 headaches a month. Her headaches normally resolve with Maxalt. The patient continues to report trouble with her memory. She is able to complete all ADLs independent. She operates a Librarian, academicmotor vehicle without difficulty. She is able to complete all of her finances. She prepares all of her meals. She reports occasionally she has a hard time falling asleep or will wake up during the night. The patient also reports that she has anxiety. In the past she was given  Lorazepam by the emergency room for  anxiety attacks. She states that she notices that she has a hard time remembering short and long-term events. She states that she was filling out her medical history and had difficulty remembering what to put on there. She returns today for an evaluation.  HISTORY 03/18/16: Cassandra Zuniga is a 46 year old right-handed white female with a history of migraine headache. The patient indicates that her headaches are occurring with the usual frequency, they are generally activated by weather changes. The patient will take Maxalt or Excedrin Migraine which seems to help. Within the last several months, the patient has noted a new problem associated with a disturbance with memory. The patient is not recalling conversations that she has had with people, she is having difficulty retaining new information. She is not sleeping well at night because of hot flashes, but she denies that she is fatigued or sleepy during the day. She does not relate the troubles with memory and concentration to the time around her migraine. She does have a history of seizures as an adolescent. She has not been on any anticonvulsant medications recently. She denies any blackout episodes or sudden changes in lapses of memory. She comes to this office for an evaluation.  REVIEW OF SYSTEMS: Out of a complete 14 system review of symptoms, the patient complains only of the following symptoms, and all other reviewed systems are negative.  Runny nose, light sensitivity, bruise easily, frequent waking, daytime sleepiness, moles  ALLERGIES: Allergies  Allergen Reactions  . Erythromycin Hives  . Erythromycin Hives  . Percocet [Oxycodone-Acetaminophen] Itching  . Phenergan [Promethazine Hcl] Other (See  Comments)    Red lines up arm  . Sulfa Antibiotics Nausea And Vomiting  . Sulfa Antibiotics Nausea And Vomiting  . Phenergan [Promethazine Hcl] Rash    HOME MEDICATIONS: Outpatient Medications Prior to Visit  Medication Sig Dispense Refill  .  albuterol (PROVENTIL HFA;VENTOLIN HFA) 108 (90 BASE) MCG/ACT inhaler Inhale 2 puffs into the lungs every 6 (six) hours as needed for wheezing or shortness of breath.    . Ascorbic Acid (VITAMIN C PO) Take 1 tablet by mouth daily.    . Cholecalciferol (VITAMIN D PO) Take 1 tablet by mouth daily.    . fluticasone (FLONASE) 50 MCG/ACT nasal spray SHAKE LQ AND U 2 SPRAYS NASALLY QD UTD  5  . levothyroxine (SYNTHROID, LEVOTHROID) 112 MCG tablet Take 112 mcg by mouth daily before breakfast.    . LORazepam (ATIVAN) 0.5 MG tablet Take 0.5 mg by mouth every 8 (eight) hours as needed for anxiety.    . Multiple Vitamin (MULTIVITAMIN) tablet Take 1 tablet by mouth 2 (two) times daily.    . ondansetron (ZOFRAN ODT) 4 MG disintegrating tablet Take 1 tablet (4 mg total) by mouth every 8 (eight) hours as needed for nausea or vomiting. 20 tablet 0  . rizatriptan (MAXALT-MLT) 10 MG disintegrating tablet   3  . cyclobenzaprine (FLEXERIL) 10 MG tablet Take 1 tablet (10 mg total) by mouth 2 (two) times daily as needed for muscle spasms. 20 tablet 0  . cyproheptadine (PERIACTIN) 4 MG tablet Take 1 tablet (4 mg total) by mouth 3 (three) times daily as needed for allergies. 30 tablet 0  . traMADol (ULTRAM) 50 MG tablet Take 1 tablet (50 mg total) by mouth every 8 (eight) hours as needed. 30 tablet 0   Facility-Administered Medications Prior to Visit  Medication Dose Route Frequency Provider Last Rate Last Dose  . triamcinolone acetonide (KENALOG) 10 MG/ML injection 10 mg  10 mg Other Once Asencion Islam, DPM        PAST MEDICAL HISTORY: Past Medical History:  Diagnosis Date  . Asthma   . Classic migraine with aura 05/23/2015  . Diverticulitis   . GERD (gastroesophageal reflux disease)   . Hypothyroidism   . Intermittent asthma   . Kidney infection   . Kidney stones   . Migraine   . Osteoporosis   . Scoliosis   . Seasonal allergies   . Seizures (HCC)   . Thyroid disease     PAST SURGICAL HISTORY: Past  Surgical History:  Procedure Laterality Date  . APPENDECTOMY    . CERVICAL DISC SURGERY    . COLON SURGERY     Diverticula was removed  . KNEE SURGERY     bilateral  . THYROGLOSSAL DUCT CYST    . TONSILLECTOMY    . TUBAL LIGATION      FAMILY HISTORY: Family History  Problem Relation Age of Onset  . GI Disease Mother        Gastroparesis  . Hypertension Mother   . Migraines Mother   . Hypothyroidism Mother   . Migraines Sister   . Diabetes Other   . Cancer Other   . Angina Paternal Grandfather   . Diabetes Paternal Grandfather   . Stroke Paternal Grandfather   . Cancer Maternal Aunt   . Cancer Maternal Uncle   . Stroke Maternal Grandmother   . Hypertension Maternal Grandfather   . Diabetes Paternal Grandmother   . Stroke Paternal Grandmother   . Hypothyroidism Son     SOCIAL  HISTORY: Social History   Social History  . Marital status: Single    Spouse name: N/A  . Number of children: 2  . Years of education: assoc.   Occupational History  . Not on file.   Social History Main Topics  . Smoking status: Former Smoker    Quit date: 07/17/1989  . Smokeless tobacco: Never Used  . Alcohol use 0.0 oz/week     Comment: very rarely  . Drug use: No  . Sexual activity: Yes    Birth control/ protection: Surgical   Other Topics Concern  . Not on file   Social History Narrative   ** Merged History Encounter **   Lives at home w/ her boyfriend and 2 children   Patient drinks about 1-2 cups of caffeine daily.   Patient is right handed.      PHYSICAL EXAM  Vitals:   04/09/17 0727  BP: 114/73  Pulse: 75  Weight: 133 lb (60.3 kg)  Height: 5\' 6"  (1.676 m)   Body mass index is 21.47 kg/m.  Generalized: Well developed, in no acute distress   Neurological examination  Mentation: Alert oriented to time, place, history taking. Follows all commands speech and language fluent. MMSE 30/30 Cranial nerve II-XII: Pupils were equal round reactive to light.  Extraocular movements were full, visual field were full on confrontational test. Facial sensation and strength were normal. Uvula tongue midline. Head turning and shoulder shrug  were normal and symmetric. Motor: The motor testing reveals 5 over 5 strength of all 4 extremities. Good symmetric motor tone is noted throughout.  Sensory: Sensory testing is intact to soft touch on all 4 extremities. No evidence of extinction is noted.  Coordination: Cerebellar testing reveals good finger-nose-finger and heel-to-shin bilaterally.  Gait and station: Gait is normal. Tandem gait is normal. Romberg is negative. No drift is seen.  Reflexes: Deep tendon reflexes are symmetric and normal bilaterally.   DIAGNOSTIC DATA (LABS, IMAGING, TESTING) - I reviewed patient records, labs, notes, testing and imaging myself where available.  Lab Results  Component Value Date   WBC 5.9 01/30/2016   HGB 13.3 01/30/2016   HCT 40.8 01/30/2016   MCV 87 01/30/2016   PLT 277 01/30/2016      Component Value Date/Time   NA 140 08/17/2015 0132   NA 141 12/24/2013 0008   K 3.9 08/17/2015 0132   K 4.0 12/24/2013 0008   CL 107 08/17/2015 0132   CL 106 12/24/2013 0008   CO2 26 08/17/2015 0132   CO2 29 12/24/2013 0008   GLUCOSE 116 (H) 08/17/2015 0132   GLUCOSE 91 12/24/2013 0008   BUN 8 08/17/2015 0132   BUN 11 12/24/2013 0008   CREATININE 0.56 08/17/2015 0132   CREATININE 0.77 12/24/2013 0008   CALCIUM 9.1 08/17/2015 0132   CALCIUM 9.3 12/24/2013 0008   PROT 7.1 03/12/2016 0855   PROT 8.1 12/24/2013 0008   ALBUMIN 4.5 03/12/2016 0855   ALBUMIN 4.3 12/24/2013 0008   AST 16 03/12/2016 0855   AST 22 12/24/2013 0008   ALT 16 03/12/2016 0855   ALT 21 12/24/2013 0008   ALKPHOS 65 03/12/2016 0855   ALKPHOS 65 12/24/2013 0008   BILITOT <0.2 03/12/2016 0855   BILITOT 0.2 12/24/2013 0008   GFRNONAA >60 08/17/2015 0132   GFRNONAA >60 12/24/2013 0008   GFRAA >60 08/17/2015 0132   GFRAA >60 12/24/2013 0008    Lab  Results  Component Value Date   VITAMINB12 357 03/18/2016   Lab  Results  Component Value Date   TSH 7.220 (H) 03/18/2016      ASSESSMENT AND PLAN 46 y.o. year old female  has a past medical history of Asthma; Classic migraine with aura (05/23/2015); Diverticulitis; GERD (gastroesophageal reflux disease); Hypothyroidism; Intermittent asthma; Kidney infection; Kidney stones; Migraine; Osteoporosis; Scoliosis; Seasonal allergies; Seizures (HCC); and Thyroid disease. here with:  1. Migraine headache 2. Memory disturbance  The patient's memory score is 30 out of 30. She reports that her memory has actually improved. For that reason I think we can postpone MRI of the brain. This was originally ordered July 2017. The patient's headaches are under relatively good control at this time. She will continue using Maxalt. I've encouraged her to keep a migraine diary. She is advised that if her symptoms worsen or she develops new symptoms she should let us know. She will follow-up in 6 months with Dr. Anne Hahn or sooner if needed.      Butch Penny, MSN, NP-C 04/09/2017, 7:34 AM [facility] [address] [place], [place] [address] [phone]

## 2017-04-09 NOTE — Patient Instructions (Signed)
Your Plan:  Continue Maxalt for migraines Memory score 30/30. If you notice that your memory is worsening please let us know.  For now we will hold off on MRI.   Thank you for coming to see us at [facility]. I hope we have been able to provide you high quality care today.  You may receive a patient satisfaction survey over the next few weeks. We would appreciate your feedback and comments so that we may continue to improve ourselves and the health of our patients.

## 2017-06-16 ENCOUNTER — Telehealth: Payer: Self-pay | Admitting: Adult Health

## 2017-06-16 NOTE — Telephone Encounter (Addendum)
Pt calling having sx of L eye lid drawing not consecutive with R and L eye vision with fuzzy rings. Occipital head pain, and R stabbing head pains.  Ongoing for the last 3 wks.  Can last 15-30 minutes.  These can happen on exertion.  Takes maxalt for rescue.  These sx are similar to what she has experienced before but are worsening.  Appt made to evaluate 06-17-17 at 0830.

## 2017-06-16 NOTE — Telephone Encounter (Signed)
Patient called office in reference to more frequent migraines without the pain.  Patient seen eye Dr on Thursday of last week with a good visit.  Patient is having vision changes with a circle of just black with a fuzzy ring around it, she is able to see around it but not thru the black circle.  Patient states that only her left eye/left side of her face has been drawing for the past month.  Pharmacy- [facility] [place]).  Please call

## 2017-06-17 ENCOUNTER — Encounter: Payer: Self-pay | Admitting: Adult Health

## 2017-06-17 ENCOUNTER — Ambulatory Visit (INDEPENDENT_AMBULATORY_CARE_PROVIDER_SITE_OTHER): Payer: Medicaid Other | Admitting: Adult Health

## 2017-06-17 VITALS — BP 139/77 | HR 68 | Ht 66.0 in | Wt 138.0 lb

## 2017-06-17 DIAGNOSIS — H539 Unspecified visual disturbance: Secondary | ICD-10-CM | POA: Diagnosis not present

## 2017-06-17 DIAGNOSIS — G43109 Migraine with aura, not intractable, without status migrainosus: Secondary | ICD-10-CM | POA: Diagnosis not present

## 2017-06-17 NOTE — Progress Notes (Signed)
I have read the note, and I agree with the clinical assessment and plan.  Cassandra Zuniga,Cassandra Zuniga   

## 2017-06-17 NOTE — Patient Instructions (Signed)
Your Plan:  Continue to monitor symptoms.  Of course for any concerning symptoms or if you have a severe headache, develop weakness/numbness in the extremities, facial droop then please call 911 If visual disturbances continue may consider Topamax If your symptoms worsen or you develop new symptoms please let us know.   Thank you for coming to see [place] at [facility]. I hope we have been able to provide you high quality care today.  You may receive a patient satisfaction survey over the next few weeks. We would appreciate your feedback and comments so that we may continue to improve ourselves and the health of our patients.  Topiramate tablets What is this medicine? TOPIRAMATE (toe PYRE a mate) is used to treat seizures in adults or children with epilepsy. It is also used for the prevention of migraine headaches. This medicine may be used for other purposes; ask your health care provider or pharmacist if you have questions. COMMON BRAND NAME(S): Topamax, Topiragen What should I tell my health care provider before I take this medicine? They need to know if you have any of these conditions: -bleeding disorders -cirrhosis of the liver or liver disease -diarrhea -glaucoma -kidney stones or kidney disease -low blood counts, like low white cell, platelet, or red cell counts -lung disease like asthma, obstructive pulmonary disease, emphysema -metabolic acidosis -on a ketogenic diet -schedule for surgery or a procedure -suicidal thoughts, plans, or attempt; a previous suicide attempt by you or a family member -an unusual or allergic reaction to topiramate, other medicines, foods, dyes, or preservatives -pregnant or trying to get pregnant -breast-feeding How should I use this medicine? Take this medicine by mouth with a glass of water. Follow the directions on the prescription label. Do not crush or chew. You may take this medicine with meals. Take your medicine at regular  intervals. Do not take it more often than directed. Talk to your pediatrician regarding the use of this medicine in children. Special care may be needed. While this drug may be prescribed for children as young as 41 years of age for selected conditions, precautions do apply. Overdosage: If you think you have taken too much of this medicine contact a poison control center or emergency room at once. NOTE: This medicine is only for you. Do not share this medicine with others. What if I miss a dose? If you miss a dose, take it as soon as you can. If your next dose is to be taken in less than 6 hours, then do not take the missed dose. Take the next dose at your regular time. Do not take double or extra doses. What may interact with this medicine? Do not take this medicine with any of the following medications: -probenecid This medicine may also interact with the following medications: -acetazolamide -alcohol -amitriptyline -aspirin and aspirin-like medicines -birth control pills -certain medicines for depression -certain medicines for seizures -certain medicines that treat or prevent blood clots like warfarin, enoxaparin, dalteparin, apixaban, dabigatran, and rivaroxaban -digoxin -hydrochlorothiazide -lithium -medicines for pain, sleep, or muscle relaxation -metformin -methazolamide -NSAIDS, medicines for pain and inflammation, like ibuprofen or naproxen -pioglitazone -risperidone This list may not describe all possible interactions. Give your health care provider a list of all the medicines, herbs, non-prescription drugs, or dietary supplements you use. Also tell them if you smoke, drink alcohol, or use illegal drugs. Some items may interact with your medicine. What should I watch for while using this medicine? Visit your doctor or health care professional  for regular checks on your progress. Do not stop taking this medicine suddenly. This increases the risk of seizures if you are using this  medicine to control epilepsy. Wear a medical identification bracelet or chain to say you have epilepsy or seizures, and carry a card that lists all your medicines. This medicine can decrease sweating and increase your body temperature. Watch for signs of deceased sweating or fever, especially in children. Avoid extreme heat, hot baths, and saunas. Be careful about exercising, especially in hot weather. Contact your health care provider right away if you notice a fever or decrease in sweating. You should drink plenty of fluids while taking this medicine. If you have had kidney stones in the past, this will help to reduce your chances of forming kidney stones. If you have stomach pain, with nausea or vomiting and yellowing of your eyes or skin, call your doctor immediately. You may get drowsy, dizzy, or have blurred vision. Do not drive, use machinery, or do anything that needs mental alertness until you know how this medicine affects you. To reduce dizziness, do not sit or stand up quickly, especially if you are an older patient. Alcohol can increase drowsiness and dizziness. Avoid alcoholic drinks. If you notice blurred vision, eye pain, or other eye problems, seek medical attention at once for an eye exam. The use of this medicine may increase the chance of suicidal thoughts or actions. Pay special attention to how you are responding while on this medicine. Any worsening of mood, or thoughts of suicide or dying should be reported to your health care professional right away. This medicine may increase the chance of developing metabolic acidosis. If left untreated, this can cause kidney stones, bone disease, or slowed growth in children. Symptoms include breathing fast, fatigue, loss of appetite, irregular heartbeat, or loss of consciousness. Call your doctor immediately if you experience any of these side effects. Also, tell your doctor about any surgery you plan on having while taking this medicine since  this may increase your risk for metabolic acidosis. Birth control pills may not work properly while you are taking this medicine. Talk to your doctor about using an extra method of birth control. Women who become pregnant while using this medicine may enroll in the [place] American Antiepileptic Drug Pregnancy Registry by calling [id number]. This registry collects information about the safety of antiepileptic drug use during pregnancy. What side effects may I notice from receiving this medicine? Side effects that you should report to your doctor or health care professional as soon as possible: -allergic reactions like skin rash, itching or hives, swelling of the face, lips, or tongue -decreased sweating and/or rise in body temperature -depression -difficulty breathing, fast or irregular breathing patterns -difficulty speaking -difficulty walking or controlling muscle movements -hearing impairment -redness, blistering, peeling or loosening of the skin, including inside the mouth -tingling, pain or numbness in the hands or feet -unusual bleeding or bruising -unusually weak or tired -worsening of mood, thoughts or actions of suicide or dying Side effects that usually do not require medical attention (report to your doctor or health care professional if they continue or are bothersome): -altered taste -back pain, joint or muscle aches and pains -diarrhea, or constipation -headache -loss of appetite -nausea -stomach upset, indigestion -tremors This list may not describe all possible side effects. Call your doctor for medical advice about side effects. You may report side effects to FDA at 1-800-FDA-1088. Where should I keep my medicine? Keep out of the reach of  children. Store at room temperature between 15 and 30 degrees C (59 and 86 degrees F) in a tightly closed container. Protect from moisture. Throw away any unused medicine after the expiration date. NOTE: This sheet is a summary. It  may not cover all possible information. If you have questions about this medicine, talk to your doctor, pharmacist, or health care provider.  2018 Elsevier/Gold Standard (2013-09-06 23:17:57)

## 2017-06-17 NOTE — Progress Notes (Signed)
PATIENT: Cassandra Zuniga DOB: 03-May-1971  REASON FOR VISIT: follow up HISTORY FROM: patient  HISTORY OF PRESENT ILLNESS: Today 06/17/17 Ms. Manfre is a 46 year old female with a history of migraine headaches. She returns today for follow-up. She states that her headaches have been under relatively good control however within the last week the frequency has increased. She states on 3 occasions she saw black spots in her vision with a ring around the spots. She states that this lasted for approximately 15-30 minutes. Something its only in one eye other times it will occur in both. She states one of the visual disturbance was followed by migraine headache however the other 2 was not. The patient does report that she does have a dull pain in the base of the head with some blurry vision in the left eye. She states that she uses Maxalt for the symptoms and went to sleep. When she woke up she did feel better and took her dog for walks. She then states that she has developed sharp shooting pains in the right side of the head. This has occurred 4-5 times in the last week however the pain only last for several seconds. She states that she has been to the eye doctor and the exam has been normal. She states on random occasions she is also felt as if the left eye was drooping. She states this only lasted for approximately 5 minutes. She returns today for an evaluation.    REVIEW OF SYSTEMS: Out of a complete 14 system review of symptoms, the patient complains only of the following symptoms, and all other reviewed systems are negative.  ALLERGIES: Allergies  Allergen Reactions  . Erythromycin Hives  . Percocet [Oxycodone-Acetaminophen] Itching  . Phenergan [Promethazine Hcl] Other (See Comments)    Red lines up arm  . Sulfa Antibiotics Nausea And Vomiting    HOME MEDICATIONS: Outpatient Medications Prior to Visit  Medication Sig Dispense Refill  . albuterol (PROVENTIL HFA;VENTOLIN HFA) 108 (90  BASE) MCG/ACT inhaler Inhale 2 puffs into the lungs every 6 (six) hours as needed for wheezing or shortness of breath.    . Ascorbic Acid (VITAMIN C PO) Take 1 tablet by mouth daily.    . Cholecalciferol (VITAMIN D PO) Take 1 tablet by mouth daily.    . cycloSPORINE (RESTASIS) 0.05 % ophthalmic emulsion Place 1 drop into both eyes 2 (two) times daily.    . fluticasone (FLONASE) 50 MCG/ACT nasal spray SHAKE LQ AND U 2 SPRAYS NASALLY QD UTD  5  . levothyroxine (SYNTHROID, LEVOTHROID) 112 MCG tablet Take 112 mcg by mouth daily before breakfast.    . LORazepam (ATIVAN) 0.5 MG tablet Take 0.5 mg by mouth every 8 (eight) hours as needed for anxiety.    . Multiple Vitamin (MULTIVITAMIN) tablet Take 1 tablet by mouth 2 (two) times daily.    . ondansetron (ZOFRAN ODT) 4 MG disintegrating tablet Take 1 tablet (4 mg total) by mouth every 8 (eight) hours as needed for nausea or vomiting. 20 tablet 0  . rizatriptan (MAXALT-MLT) 10 MG disintegrating tablet   3   Facility-Administered Medications Prior to Visit  Medication Dose Route Frequency Provider Last Rate Last Dose  . triamcinolone acetonide (KENALOG) 10 MG/ML injection 10 mg  10 mg Other Once Asencion Islam, DPM        PAST MEDICAL HISTORY: Past Medical History:  Diagnosis Date  . Asthma   . Classic migraine with aura 05/23/2015  . Diverticulitis   .  GERD (gastroesophageal reflux disease)   . Hypothyroidism   . Intermittent asthma   . Kidney infection   . Kidney stones   . Migraine   . Osteoporosis   . Scoliosis   . Seasonal allergies   . Seizures (HCC)   . Thyroid disease     PAST SURGICAL HISTORY: Past Surgical History:  Procedure Laterality Date  . APPENDECTOMY    . CERVICAL DISC SURGERY    . COLON SURGERY     Diverticula was removed  . KNEE SURGERY     bilateral  . THYROGLOSSAL DUCT CYST    . TONSILLECTOMY    . TUBAL LIGATION      FAMILY HISTORY: Family History  Problem Relation Age of Onset  . GI Disease Mother         Gastroparesis  . Hypertension Mother   . Migraines Mother   . Hypothyroidism Mother   . Migraines Sister   . Diabetes Other   . Cancer Other   . Angina Paternal Grandfather   . Diabetes Paternal Grandfather   . Stroke Paternal Grandfather   . Cancer Maternal Aunt   . Cancer Maternal Uncle   . Stroke Maternal Grandmother   . Hypertension Maternal Grandfather   . Diabetes Paternal Grandmother   . Stroke Paternal Grandmother   . Hypothyroidism Son     SOCIAL HISTORY: Social History   Social History  . Marital status: Single    Spouse name: N/A  . Number of children: 2  . Years of education: assoc.   Occupational History  . Not on file.   Social History Main Topics  . Smoking status: Former Smoker    Quit date: 07/17/1989  . Smokeless tobacco: Never Used  . Alcohol use 0.0 oz/week     Comment: very rarely  . Drug use: No  . Sexual activity: Yes    Birth control/ protection: Surgical   Other Topics Concern  . Not on file   Social History Narrative   ** Merged History Encounter **   Lives at home w/ her boyfriend and 2 children   Patient drinks about 1-2 cups of caffeine daily.   Patient is right handed.      PHYSICAL EXAM  Vitals:   06/17/17 0838  BP: 139/77  Pulse: 68  Weight: 138 lb (62.6 kg)  Height:  (1.676 m)   Body mass index is 22.27 kg/m.  Generalized: Well developed, in no acute distress   Neurological examination  Mentation: Alert oriented to time, place, history taking. Follows all commands speech and language fluent Cranial nerve II-XII: Pupils were equal round reactive to light. Extraocular movements were full, visual field were full on confrontational test. Facial sensation and strength were normal. Uvula tongue midline. Head turning and shoulder shrug  were normal and symmetric. Motor: The motor testing reveals 5 over 5 strength of all 4 extremities. Good symmetric motor tone is noted throughout.  Sensory: Sensory testing is intact  to soft touch on all 4 extremities. No evidence of extinction is noted.  Coordination: Cerebellar testing reveals good finger-nose-finger and heel-to-shin bilaterally.  Gait and station: Gait is normal. Tandem gait is normal. Romberg is negative. No drift is seen.  Reflexes: Deep tendon reflexes are symmetric and normal bilaterally.   DIAGNOSTIC DATA (LABS, IMAGING, TESTING) - I reviewed patient records, labs, notes, testing and imaging myself where available.  Lab Results  Component Value Date   WBC 5.9 01/30/2016   HGB 13.3 01/30/2016  HCT 40.8 01/30/2016   MCV 87 01/30/2016   PLT 277 01/30/2016      Component Value Date/Time   NA 140 08/17/2015 0132   NA 141 12/24/2013 0008   K 3.9 08/17/2015 0132   K 4.0 12/24/2013 0008   CL 107 08/17/2015 0132   CL 106 12/24/2013 0008   CO2 26 08/17/2015 0132   CO2 29 12/24/2013 0008   GLUCOSE 116 (H) 08/17/2015 0132   GLUCOSE 91 12/24/2013 0008   BUN 8 08/17/2015 0132   BUN 11 12/24/2013 0008   CREATININE 0.56 08/17/2015 0132   CREATININE 0.77 12/24/2013 0008   CALCIUM 9.1 08/17/2015 0132   CALCIUM 9.3 12/24/2013 0008   PROT 7.1 03/12/2016 0855   PROT 8.1 12/24/2013 0008   ALBUMIN 4.5 03/12/2016 0855   ALBUMIN 4.3 12/24/2013 0008   AST 16 03/12/2016 0855   AST 22 12/24/2013 0008   ALT 16 03/12/2016 0855   ALT 21 12/24/2013 0008   ALKPHOS 65 03/12/2016 0855   ALKPHOS 65 12/24/2013 0008   BILITOT <0.2 03/12/2016 0855   BILITOT 0.2 12/24/2013 0008   GFRNONAA >60 08/17/2015 0132   GFRNONAA >60 12/24/2013 0008   GFRAA >60 08/17/2015 0132   GFRAA >60 12/24/2013 0008   No results found for: CHOL, HDL, LDLCALC, LDLDIRECT, TRIG, CHOLHDL No results found for: ZOXW9U Lab Results  Component Value Date   VITAMINB12 357 03/18/2016   Lab Results  Component Value Date   TSH 7.220 (H) 03/18/2016      ASSESSMENT AND PLAN 46 y.o. year old female  has a past medical history of Asthma; Classic migraine with aura (05/23/2015);  Diverticulitis; GERD (gastroesophageal reflux disease); Hypothyroidism; Intermittent asthma; Kidney infection; Kidney stones; Migraine; Osteoporosis; Scoliosis; Seasonal allergies; Seizures (HCC); and Thyroid disease. here with:  1. Migraine headaches  The patient's symptoms sound as if this is a migraine with an aura. However she has never had an MRI of the brain. We will proceed with an MRI to look for any acute changes that could be causing the symptoms. I discussed with the patient about potentially going on a preventative medication to alleviate her symptoms. She voiced understanding that would like to wait until we have results of the MRI. Patient is advised that if she develops a severe headache or numbness or weakness in the arms or legs, facial droop or any concerning symptoms she should call 911. She voiced understanding. She will follow-up in 6 months or sooner if needed.  I spent 15 minutes with the patient. 50% of this time was spent discussing her symptoms and treatment      Butch Penny, MSN, NP-C 06/17/2017, 8:40 AM [facility] [address], [address] [place], [place] [address] [phone]

## 2017-07-07 ENCOUNTER — Ambulatory Visit
Admission: RE | Admit: 2017-07-07 | Discharge: 2017-07-07 | Disposition: A | Discharge status: Home / Self Care | Payer: Self-pay | Source: Ambulatory Visit | Attending: Adult Health | Admitting: Adult Health

## 2017-07-07 DIAGNOSIS — G43109 Migraine with aura, not intractable, without status migrainosus: Secondary | ICD-10-CM

## 2017-07-07 MED ORDER — GADOBENATE DIMEGLUMINE 529 MG/ML IV SOLN
12.0000 mL | Freq: Once | INTRAVENOUS | Status: AC | PRN
  Administered 2017-07-07: 12 mL via INTRAVENOUS

## 2017-07-15 ENCOUNTER — Telehealth: Payer: Self-pay | Admitting: Adult Health

## 2017-07-15 NOTE — Telephone Encounter (Signed)
I called the patient LVM for her to call office. 

## 2017-07-15 NOTE — Telephone Encounter (Signed)
Patient is returning your call.  

## 2017-07-16 MED ORDER — NORTRIPTYLINE HCL 10 MG PO CAPS: ORAL_CAPSULE | ORAL | 5 refills | Status: DC

## 2017-07-16 NOTE — Addendum Note (Signed)
Addended by: Enedina FinnerMILLIKAN, Rayelynn Loyal P on: 07/16/2017 10:26 AM   Modules accepted: Orders

## 2017-07-16 NOTE — Telephone Encounter (Signed)
I called the patient.  I reviewed her MRI report with her.  There were no acute changes that could be the cause of headaches.  The patient is not on preventative medication she reports that she has been on Topamax in the past but she currently has kidney stones.For that reason we will try her on nortriptyline 10 at bedtime for 1 week then increasing to 20 mg thereafter.  I have reviewed side effects with the patient.  She denies any cardiac history or arrhythmias.  She is advised that if her symptoms do not improve she should let us know.

## 2017-10-14 ENCOUNTER — Ambulatory Visit: Payer: Medicaid Other | Admitting: Neurology

## 2017-10-14 ENCOUNTER — Encounter: Payer: Self-pay | Admitting: Neurology

## 2017-10-14 ENCOUNTER — Encounter (INDEPENDENT_AMBULATORY_CARE_PROVIDER_SITE_OTHER): Payer: Self-pay

## 2017-10-14 VITALS — BP 126/73 | HR 69 | Ht 66.0 in | Wt 140.0 lb

## 2017-10-14 DIAGNOSIS — G43009 Migraine without aura, not intractable, without status migrainosus: Secondary | ICD-10-CM | POA: Diagnosis not present

## 2017-10-14 DIAGNOSIS — F410 Panic disorder [episodic paroxysmal anxiety] without agoraphobia: Secondary | ICD-10-CM | POA: Diagnosis not present

## 2017-10-14 HISTORY — DX: Panic disorder (episodic paroxysmal anxiety): F41.0

## 2017-10-14 MED ORDER — ESCITALOPRAM OXALATE 10 MG PO TABS: 10.0000 mg | ORAL_TABLET | Freq: Every day | ORAL | 3 refills | Status: DC

## 2017-10-14 MED ORDER — NORTRIPTYLINE HCL 10 MG PO CAPS: 10.0000 mg | ORAL_CAPSULE | Freq: Every day | ORAL | 5 refills | Status: DC

## 2017-10-14 NOTE — Progress Notes (Signed)
Reason for visit: Migraine headache  Cassandra Zuniga is an 47 y.o. female  History of present illness:  Cassandra Zuniga is a 47 year old right-handed white female with a history of migraine headache.  The patient was given was placed on nortriptyline on the last visit which seems to have helped her headache.  She has not had any migraine in over 1 month.  However, she is having significant constipation issues on 20 mg at night on the nortriptyline, she has cut back to 10 mg at night within the last 10 days.  She is taking docusate but she is not on any laxatives for the constipation.  She has been under a lot of stress recently as her daughter was in a motor vehicle accident.  She has an underlying panic disorder, she has had 3 panic attacks within the last week.  She has gone to the emergency room previously with chest pain associated with anxiety.  In the past she has been on Lexapro in the past but she stopped the medication as she learned about the potential for serotonin syndrome associated with Maxalt use.  The patient was given Ativan to the emergency room.  She returns to the office today for an evaluation.  In the past she has complained of memory problems, this has not progressed over time.  The patient is thinking about going back to work, she has not worked in several years.  Past Medical History:  Diagnosis Date  . Asthma   . Classic migraine with aura 05/23/2015  . Diverticulitis   . GERD (gastroesophageal reflux disease)   . Hypothyroidism   . Intermittent asthma   . Kidney infection   . Kidney stones   . Migraine   . Osteoporosis   . Scoliosis   . Seasonal allergies   . Seizures (HCC)   . Thyroid disease     Past Surgical History:  Procedure Laterality Date  . APPENDECTOMY    . CERVICAL DISC SURGERY    . COLON SURGERY     Diverticula was removed  . KNEE SURGERY     bilateral  . THYROGLOSSAL DUCT CYST    . TONSILLECTOMY    . TUBAL LIGATION      Family History    Problem Relation Age of Onset  . GI Disease Mother        Gastroparesis  . Hypertension Mother   . Migraines Mother   . Hypothyroidism Mother   . Migraines Sister   . Diabetes Other   . Cancer Other   . Angina Paternal Grandfather   . Diabetes Paternal Grandfather   . Stroke Paternal Grandfather   . Cancer Maternal Aunt   . Cancer Maternal Uncle   . Stroke Maternal Grandmother   . Hypertension Maternal Grandfather   . Diabetes Paternal Grandmother   . Stroke Paternal Grandmother   . Hypothyroidism Son     Social history:  reports that she quit smoking about 28 years ago. she has never used smokeless tobacco. She reports that she drinks alcohol. She reports that she does not use drugs.    Allergies  Allergen Reactions  . Erythromycin Hives  . Percocet [Oxycodone-Acetaminophen] Itching  . Phenergan [Promethazine Hcl] Other (See Comments)    Red lines up arm  . Sulfa Antibiotics Nausea And Vomiting    Medications:  Prior to Admission medications   Medication Sig Start Date End Date Taking? Authorizing Provider  albuterol (PROVENTIL HFA;VENTOLIN HFA) 108 (90 BASE) MCG/ACT inhaler Inhale  2 puffs into the lungs every 6 (six) hours as needed for wheezing or shortness of breath.   Yes [provider]  cycloSPORINE (RESTASIS) 0.05 % ophthalmic emulsion Place 1 drop into both eyes 2 (two) times daily.   Yes [provider]  fluticasone (FLONASE) 50 MCG/ACT nasal spray SHAKE LQ AND U 2 SPRAYS NASALLY QD UTD 10/18/15  Yes [provider]  levothyroxine (SYNTHROID, LEVOTHROID) 112 MCG tablet Take 112 mcg by mouth daily before breakfast.   Yes [provider]  Multiple Vitamin (MULTIVITAMIN) tablet Take 1 tablet by mouth 2 (two) times daily.   Yes [provider]  nortriptyline (PAMELOR) 10 MG capsule Take 1 capsule PO at bedtime for 1 week then increase to 2 capsules at bedtime 07/16/17  Yes Butch PennyMillikan, Megan, NP  rizatriptan (MAXALT-MLT) 10 MG  disintegrating tablet  08/23/15  Yes [provider]    ROS:  Out of a complete 14 system review of symptoms, the patient complains only of the following symptoms, and all other reviewed systems are negative.  Anxiety Headache  Blood pressure 126/73, pulse 69, height 5\' 6"  (1.676 m), weight 140 lb (63.5 kg).  Physical Exam  General: The patient is alert and cooperative at the time of the examination.  Skin: No significant peripheral edema is noted.   Neurologic Exam  Mental status: The patient is alert and oriented x 3 at the time of the examination. The patient has apparent normal recent and remote memory, with an apparently normal attention span and concentration ability.   Cranial nerves: Facial symmetry is present. Speech is normal, no aphasia or dysarthria is noted. Extraocular movements are full. Visual fields are full.  Motor: The patient has good strength in all 4 extremities.  Sensory examination: Soft touch sensation is symmetric on the face, arms, and legs.  Coordination: The patient has good finger-nose-finger and heel-to-shin bilaterally.  Gait and station: The patient has a normal gait. Tandem gait is normal. Romberg is negative. No drift is seen.  Reflexes: Deep tendon reflexes are symmetric.   MRI brain 07/07/17:  IMPRESSION:  This MRI of the brain with and without contrast shows the following: 1.    There are scattered T2/FLAIR hyperintense foci in the subcortical and deep white matter of the hemispheres. This is a nonspecific finding and could be due to mild chronic microvascular ischemic change or be the sequela of migraine or cardio-emboli. The foci are not typical of demyelination.  2.    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 3.    There are no acute findings.  * MRI scan images were reviewed online. I agree with the written report.   Assessment/Plan:  1.  Migraine  headache  2.  Anxiety, panic disorder  3.  Reported mild memory problems  The patient is doing quite well with her migraine headache.  She is to start taking MiraLAX on a regular basis to help combat the constipation associated with the nortriptyline.  She is having significant issues with anxiety and panic attacks, we will restart Lexapro, she has been on this in the past.  She will call for any dose adjustments.  She will follow-up in 1 year, sooner if needed.   Marlan Palau. Keith Willis MD 10/14/2017 8:11 AM  [facility] [address] [place], [place] [id number]  Phone [phone] Fax [phone]

## 2017-10-14 NOTE — Patient Instructions (Signed)
We will go on lexapro 10 mg a day, call for any dose adjustments.

## 2017-11-19 ENCOUNTER — Emergency Department: Payer: Medicaid Other

## 2017-11-19 ENCOUNTER — Other Ambulatory Visit: Payer: Self-pay

## 2017-11-19 ENCOUNTER — Emergency Department
Admission: EM | Admit: 2017-11-19 | Discharge: 2017-11-19 | Disposition: A | Discharge status: Home / Self Care | Payer: Medicaid Other | Attending: Emergency Medicine | Admitting: Emergency Medicine

## 2017-11-19 ENCOUNTER — Encounter: Payer: Self-pay | Admitting: *Deleted

## 2017-11-19 DIAGNOSIS — J45909 Unspecified asthma, uncomplicated: Secondary | ICD-10-CM | POA: Diagnosis not present

## 2017-11-19 DIAGNOSIS — S92531A Displaced fracture of distal phalanx of right lesser toe(s), initial encounter for closed fracture: Secondary | ICD-10-CM | POA: Insufficient documentation

## 2017-11-19 DIAGNOSIS — Z87891 Personal history of nicotine dependence: Secondary | ICD-10-CM | POA: Insufficient documentation

## 2017-11-19 DIAGNOSIS — Y939 Activity, unspecified: Secondary | ICD-10-CM | POA: Diagnosis not present

## 2017-11-19 DIAGNOSIS — Z79899 Other long term (current) drug therapy: Secondary | ICD-10-CM | POA: Insufficient documentation

## 2017-11-19 DIAGNOSIS — Y999 Unspecified external cause status: Secondary | ICD-10-CM | POA: Insufficient documentation

## 2017-11-19 DIAGNOSIS — E039 Hypothyroidism, unspecified: Secondary | ICD-10-CM | POA: Insufficient documentation

## 2017-11-19 DIAGNOSIS — S99921A Unspecified injury of right foot, initial encounter: Secondary | ICD-10-CM | POA: Diagnosis present

## 2017-11-19 DIAGNOSIS — Y929 Unspecified place or not applicable: Secondary | ICD-10-CM | POA: Diagnosis not present

## 2017-11-19 DIAGNOSIS — W500XXA Accidental hit or strike by another person, initial encounter: Secondary | ICD-10-CM | POA: Diagnosis not present

## 2017-11-19 MED ORDER — HYDROCODONE-ACETAMINOPHEN 5-325 MG PO TABS: 1.0000 | ORAL_TABLET | Freq: Four times a day (QID) | ORAL | 0 refills | Status: AC | PRN
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
## 2017-11-24 ENCOUNTER — Emergency Department (HOSPITAL_COMMUNITY)
Admission: EM | Admit: 2017-11-24 | Discharge: 2017-11-25 | Disposition: A | Discharge status: Home / Self Care | Payer: Medicaid Other | Attending: Emergency Medicine | Admitting: Emergency Medicine

## 2017-11-24 ENCOUNTER — Encounter (HOSPITAL_COMMUNITY): Payer: Self-pay | Admitting: Emergency Medicine

## 2017-11-24 DIAGNOSIS — J45909 Unspecified asthma, uncomplicated: Secondary | ICD-10-CM | POA: Diagnosis not present

## 2017-11-24 DIAGNOSIS — R7989 Other specified abnormal findings of blood chemistry: Secondary | ICD-10-CM

## 2017-11-24 DIAGNOSIS — Z79899 Other long term (current) drug therapy: Secondary | ICD-10-CM | POA: Diagnosis not present

## 2017-11-24 DIAGNOSIS — Z87891 Personal history of nicotine dependence: Secondary | ICD-10-CM | POA: Insufficient documentation

## 2017-11-24 DIAGNOSIS — E039 Hypothyroidism, unspecified: Secondary | ICD-10-CM | POA: Diagnosis not present

## 2017-11-24 DIAGNOSIS — R945 Abnormal results of liver function studies: Secondary | ICD-10-CM | POA: Insufficient documentation

## 2017-11-24 DIAGNOSIS — R55 Syncope and collapse: Secondary | ICD-10-CM | POA: Diagnosis not present

## 2017-11-24 LAB — COMPREHENSIVE METABOLIC PANEL
ALBUMIN: 3.9 g/dL (ref 3.5–5.0)
ALK PHOS: 67 U/L (ref 38–126)
ALT: 79 U/L — AB (ref 14–54)
ANION GAP: 9 (ref 5–15)
AST: 63 U/L — ABNORMAL HIGH (ref 15–41)
BUN: 8 mg/dL (ref 6–20)
CALCIUM: 9.4 mg/dL (ref 8.9–10.3)
CHLORIDE: 103 mmol/L (ref 101–111)
CO2: 26 mmol/L (ref 22–32)
Creatinine, Ser: 0.63 mg/dL (ref 0.44–1.00)
GFR calc non Af Amer: >60 mL/min (ref >60)
GLUCOSE: 97 mg/dL (ref 65–99)
Potassium: 4.1 mmol/L (ref 3.5–5.1)
Sodium: 138 mmol/L (ref 135–145)
Total Bilirubin: 0.2 mg/dL — ABNORMAL LOW (ref 0.3–1.2)
Total Protein: 7.5 g/dL (ref 6.5–8.1)

## 2017-11-24 LAB — CBC WITH DIFFERENTIAL/PLATELET
BASOS PCT: 0 %
Basophils Absolute: 0 10*3/uL (ref 0.0–0.1)
EOS ABS: 0.1 10*3/uL (ref 0.0–0.7)
EOS PCT: 1 %
HCT: 40.1 % (ref 36.0–46.0)
HEMOGLOBIN: 13.2 g/dL (ref 12.0–15.0)
Lymphocytes Relative: 10 %
Lymphs Abs: 1.4 10*3/uL (ref 0.7–4.0)
MCH: 28.8 pg (ref 26.0–34.0)
MCHC: 32.9 g/dL (ref 30.0–36.0)
MCV: 87.6 fL (ref 78.0–100.0)
Monocytes Absolute: 1.2 10*3/uL — ABNORMAL HIGH (ref 0.1–1.0)
Monocytes Relative: 9 %
NEUTROS PCT: 80 %
Neutro Abs: 11 10*3/uL — ABNORMAL HIGH (ref 1.7–7.7)
PLATELETS: 268 10*3/uL (ref 150–400)
RBC: 4.58 MIL/uL (ref 3.87–5.11)
RDW: 13.3 % (ref 11.5–15.5)
WBC: 13.8 10*3/uL — AB (ref 4.0–10.5)

## 2017-11-24 LAB — I-STAT BETA HCG BLOOD, ED (MC, WL, AP ONLY): I-stat hCG, quantitative: <5 m[IU]/mL (ref <5)

## 2017-11-24 LAB — ETHANOL

## 2017-11-24 LAB — CBG MONITORING, ED: Glucose-Capillary: 91 mg/dL (ref 65–99)

## 2017-11-24 MED ORDER — SODIUM CHLORIDE 0.9 % IV BOLUS (SEPSIS)
1000.0000 mL | Freq: Once | INTRAVENOUS | Status: AC
  Administered 2017-11-24: 1000 mL via INTRAVENOUS

## 2017-11-24 MED ORDER — KETOROLAC TROMETHAMINE 30 MG/ML IJ SOLN
15.0000 mg | Freq: Once | INTRAMUSCULAR | Status: AC
  Administered 2017-11-24: 15 mg via INTRAVENOUS
  Filled 2017-11-24: qty 1
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
## 2017-11-24 NOTE — Discharge Instructions (Addendum)
Please make a follow up appointment with your doctor to recheck liver enzymes Return if worsening

## 2017-11-24 NOTE — ED Triage Notes (Signed)
Patient arrived with EMS from home reports brief syncopal episode this evening with mild lightheadedness/dizziness and fatigue , alert and oriented at arrival , CBG= 102 by EMS , alert and oriented at arrival with no neuro deficits .

## 2017-12-06 ENCOUNTER — Encounter (HOSPITAL_COMMUNITY): Payer: Self-pay | Admitting: Emergency Medicine

## 2017-12-06 ENCOUNTER — Other Ambulatory Visit: Payer: Self-pay

## 2017-12-06 ENCOUNTER — Emergency Department (HOSPITAL_COMMUNITY)
Admission: EM | Admit: 2017-12-06 | Discharge: 2017-12-06 | Disposition: A | Discharge status: Home / Self Care | Payer: Medicaid Other | Attending: Emergency Medicine | Admitting: Emergency Medicine

## 2017-12-06 DIAGNOSIS — E039 Hypothyroidism, unspecified: Secondary | ICD-10-CM | POA: Insufficient documentation

## 2017-12-06 DIAGNOSIS — J45909 Unspecified asthma, uncomplicated: Secondary | ICD-10-CM | POA: Diagnosis not present

## 2017-12-06 DIAGNOSIS — G43901 Migraine, unspecified, not intractable, with status migrainosus: Secondary | ICD-10-CM

## 2017-12-06 DIAGNOSIS — Z79899 Other long term (current) drug therapy: Secondary | ICD-10-CM | POA: Insufficient documentation

## 2017-12-06 DIAGNOSIS — Z87891 Personal history of nicotine dependence: Secondary | ICD-10-CM | POA: Insufficient documentation

## 2017-12-06 DIAGNOSIS — R51 Headache: Secondary | ICD-10-CM | POA: Diagnosis present

## 2017-12-06 LAB — I-STAT BETA HCG BLOOD, ED (MC, WL, AP ONLY): I-stat hCG, quantitative: <5 m[IU]/mL (ref <5)

## 2017-12-06 MED ORDER — MAGNESIUM SULFATE 2 GM/50ML IV SOLN
2.0000 g | Freq: Once | INTRAVENOUS | Status: AC
  Administered 2017-12-06: 2 g via INTRAVENOUS
  Filled 2017-12-06: qty 50

## 2017-12-06 MED ORDER — DIPHENHYDRAMINE HCL 50 MG/ML IJ SOLN
25.0000 mg | Freq: Once | INTRAMUSCULAR | Status: AC
Start: 2017-12-06 — End: 2017-12-06
  Administered 2017-12-06: 25 mg via INTRAVENOUS
  Filled 2017-12-06: qty 1

## 2017-12-06 MED ORDER — KETOROLAC TROMETHAMINE 30 MG/ML IJ SOLN
30.0000 mg | Freq: Once | INTRAMUSCULAR | Status: AC
  Administered 2017-12-06: 30 mg via INTRAVENOUS
  Filled 2017-12-06: qty 1

## 2017-12-06 MED ORDER — LACTATED RINGERS IV BOLUS (SEPSIS)
1000.0000 mL | Freq: Once | INTRAVENOUS | Status: AC
Start: 2017-12-06 — End: 2017-12-06
  Administered 2017-12-06: 1000 mL via INTRAVENOUS

## 2017-12-06 MED ORDER — DEXAMETHASONE SODIUM PHOSPHATE 10 MG/ML IJ SOLN
10.0000 mg | Freq: Once | INTRAMUSCULAR | Status: AC
  Administered 2017-12-06: 10 mg via INTRAVENOUS
  Filled 2017-12-06: qty 1

## 2017-12-06 MED ORDER — PROCHLORPERAZINE EDISYLATE 5 MG/ML IJ SOLN
10.0000 mg | Freq: Once | INTRAMUSCULAR | Status: AC
  Administered 2017-12-06: 10 mg via INTRAVENOUS
  Filled 2017-12-06: qty 2

## 2017-12-06 NOTE — ED Triage Notes (Signed)
Woke up with headache ,Rizatriptan 10mg  took one at 0700  AND ANOTHER ONE 2-3 HOURS later no help. I started throwing up , usually that will help but it has not.

## 2017-12-06 NOTE — ED Notes (Signed)
Pt discharged from ED; instructions provided; Pt encouraged to return to ED if symptoms worsen and to f/u with PCP; Pt verbalized understanding of all instructions 

## 2017-12-06 NOTE — ED Provider Notes (Signed)
[facility] EMERGENCY DEPARTMENT Provider Note   CSN: [id number] Arrival date & time: 12/06/17  1407     History   Chief Complaint Chief Complaint  Patient presents with  . Headache    HPI Cassandra Zuniga is a 47 y.o. female.  47 year old female with past medical history including migraines, diverticulitis, asthma who presents with migraine.  Patient states that she woke up this morning with headache typical of her usual migraine.  The pain starts in the front of her head and moves to the back.  Pain became severe today despite taking her daily amitriptyline medication.  She took Rizatriptan at 7 AM and took another dose 2-3 hours later with no relief.  She has had nausea and vomiting which is typical when she has a bad migraine.  She denies any head trauma, fevers, or recent illness.  No vision changes or extremity numbness/weakness. She endorses photophobia.  The history is provided by the patient.    Past Medical History:  Diagnosis Date  . Asthma   . Classic migraine with aura 05/23/2015  . Diverticulitis   . GERD (gastroesophageal reflux disease)   . Hypothyroidism   . Intermittent asthma   . Kidney infection   . Kidney stones   . Migraine   . Osteoporosis   . Panic disorder 10/14/2017  . Scoliosis   . Seasonal allergies   . Seizures (HCC)   . Thyroid disease     Patient Active Problem List   Diagnosis Date Noted  . Panic disorder 10/14/2017  . Memory change 03/18/2016  . Classic migraine with aura 05/23/2015  . Chest pain 04/23/2014  . Migraine 04/23/2014  . Hypothyroidism 04/23/2014  . Bradycardia 04/23/2014  . Intermittent asthma 04/23/2014    Past Surgical History:  Procedure Laterality Date  . APPENDECTOMY    . CERVICAL DISC SURGERY    . COLON SURGERY     Diverticula was removed  . KNEE SURGERY     bilateral  . THYROGLOSSAL DUCT CYST    . TONSILLECTOMY    . TUBAL LIGATION       OB History   None      Home Medications     Prior to Admission medications   Medication Sig Start Date End Date Taking? Authorizing Provider  albuterol (PROVENTIL HFA;VENTOLIN HFA) 108 (90 BASE) MCG/ACT inhaler Inhale 2 puffs into the lungs every 6 (six) hours as needed for wheezing or shortness of breath.    [provider]  cycloSPORINE (RESTASIS) 0.05 % ophthalmic emulsion Place 1 drop into both eyes 2 (two) times daily.    [provider]  escitalopram (LEXAPRO) 10 MG tablet Take 1 tablet (10 mg total) by mouth daily. 10/14/17   York Spaniel, MD  fluticasone (FLONASE) 50 MCG/ACT nasal spray SHAKE LQ AND U 2 SPRAYS NASALLY QD UTD 10/18/15   [provider]  levothyroxine (SYNTHROID, LEVOTHROID) 112 MCG tablet Take 112 mcg by mouth daily before breakfast.    [provider]  Multiple Vitamin (MULTIVITAMIN) tablet Take 1 tablet by mouth 2 (two) times daily.    [provider]  nortriptyline (PAMELOR) 10 MG capsule Take 1 capsule (10 mg total) by mouth at bedtime. 10/14/17   York Spaniel, MD  rizatriptan (MAXALT-MLT) 10 MG disintegrating tablet  08/23/15   [provider]    Family History Family History  Problem Relation Age of Onset  . GI Disease Mother        Gastroparesis  .  Hypertension Mother   . Migraines Mother   . Hypothyroidism Mother   . Migraines Sister   . Diabetes Other   . Cancer Other   . Angina Paternal Grandfather   . Diabetes Paternal Grandfather   . Stroke Paternal Grandfather   . Cancer Maternal Aunt   . Cancer Maternal Uncle   . Stroke Maternal Grandmother   . Hypertension Maternal Grandfather   . Diabetes Paternal Grandmother   . Stroke Paternal Grandmother   . Hypothyroidism Son     Social History Social History   Tobacco Use  . Smoking status: Former Smoker    Last attempt to quit: 07/17/1989    Years since quitting: 28.4  . Smokeless tobacco: Never Used  Substance Use Topics  . Alcohol use: Yes    Alcohol/week: 0.0 oz     Comment: very rarely  . Drug use: No     Allergies   Erythromycin; Percocet [oxycodone-acetaminophen]; Phenergan [promethazine hcl]; and Sulfa antibiotics   Review of Systems Review of Systems All other systems reviewed and are negative except that which was mentioned in HPI   Physical Exam Updated Vital Signs BP (!) 133/95   Pulse 76   Temp 98.2 F (36.8 C) (Oral)   Resp 16   Ht 5\' 6"  (1.676 m)   Wt 61.2 kg (135 lb)   SpO2 98%   BMI 21.79 kg/m   Physical Exam  Constitutional: She is oriented to person, place, and time. She appears well-developed and well-nourished. No distress.  Awake, alert Tearful, in distress due to pain  HENT:  Head: Normocephalic and atraumatic.  Eyes: Pupils are equal, round, and reactive to light. Conjunctivae and EOM are normal.  Neck: Neck supple.  Cardiovascular: Normal rate, regular rhythm and normal heart sounds.  No murmur heard. Pulmonary/Chest: Effort normal and breath sounds normal. No respiratory distress.  Abdominal: Soft. Bowel sounds are normal. She exhibits no distension. There is no tenderness.  Musculoskeletal: She exhibits no edema.  Neurological: She is alert and oriented to person, place, and time. She has normal reflexes. She displays normal reflexes. No cranial nerve deficit. She exhibits normal muscle tone.  Fluent speech, normal finger-to-nose testing 5/5 strength and normal sensation x all 4 extremities  Skin: Skin is warm and dry.  Psychiatric: She has a normal mood and affect. Judgment and thought content normal.  Nursing note and vitals reviewed.    ED Treatments / Results  Labs (all labs ordered are listed, but only abnormal results are displayed) Labs Reviewed  I-STAT BETA HCG BLOOD, ED ([facility], [facility], [facility] ONLY)    EKG None  Radiology No results found.  Procedures Procedures (including critical care time)  Medications Ordered in ED Medications  lactated ringers bolus 1,000 mL (0 mLs Intravenous Stopped  12/06/17 1904)  diphenhydrAMINE (BENADRYL) injection 25 mg (25 mg Intravenous Given 12/06/17 1831)  prochlorperazine (COMPAZINE) injection 10 mg (10 mg Intravenous Given 12/06/17 1831)  magnesium sulfate IVPB 2 g 50 mL (0 g Intravenous Stopped 12/06/17 1942)  dexamethasone (DECADRON) injection 10 mg (10 mg Intravenous Given 12/06/17 1832)  ketorolac (TORADOL) 30 MG/ML injection 30 mg (30 mg Intravenous Given 12/06/17 1949)     Initial Impression / Assessment and Plan / ED Course  I have reviewed the triage vital signs and the nursing notes.  Pertinent labs  that were available during my care of the patient were reviewed by me and considered in my medical decision making (see chart for details).  Pt p/w usual migraine, unrelieved by home medications.  In distress due to pain but with reassuring vital signs, normal neurologic exam and patient denies any neurologic symptoms aside from headache.  Given no abnormal symptoms and reassuring exam, I do not feel she needs any head imaging.  Gave the patient above migraine medications.  On reassessment she was resting comfortably and stated that her headache had almost resolved.  I discussed supportive measures, follow-up with neurologist if migraines are persistent or increase in frequency.  Extensively reviewed return precautions.  She voiced understanding and was discharged in satisfactory condition.  Final Clinical Impressions(s) / ED Diagnoses   Final diagnoses:  Migraine with status migrainosus, not intractable, unspecified migraine type    ED Discharge Orders    None       Little, Ambrose Finlandachel Morgan, MD 12/06/17 2142

## 2017-12-09 ENCOUNTER — Encounter: Payer: Self-pay | Admitting: Adult Health

## 2017-12-09 ENCOUNTER — Ambulatory Visit: Payer: Medicaid Other | Admitting: Adult Health

## 2017-12-09 VITALS — BP 131/81 | HR 66 | Ht 66.0 in | Wt 142.8 lb

## 2017-12-09 DIAGNOSIS — G43009 Migraine without aura, not intractable, without status migrainosus: Secondary | ICD-10-CM

## 2017-12-09 NOTE — Progress Notes (Signed)
I have read the note, and I agree with the clinical assessment and plan.  Charles K Willis   

## 2017-12-09 NOTE — Progress Notes (Signed)
PATIENT: Cassandra Zuniga DOB: September 30, 1970  REASON FOR VISIT: follow up HISTORY FROM: patient  HISTORY OF PRESENT ILLNESS: Today 12/09/17   Cassandra Zuniga is a 47 year old female with a history of migraine headaches returns today for follow-up.  She reports that earlier this week she had to go to the emergency room for migraine.  She reports that she had a severe migraine that did not respond to 2 doses of Maxalt.  She states that when she got in the emergency room and they gave her IV medication the headache did resolve.  She states the next day she had a pain in the right arm.  She states that when she woke up that pain had moved to the right side of the neck.  She also reports that yesterday she was wearing at a computer screen most of the day and this may be contributing to some of her neck discomfort.  She reports that yesterday she was having trouble with her thought process however that is better today.  She states that she is slightly sluggish today but contributes this to not sleeping well last night.  She states that the symptoms are typical of her migraines.  Before this week her migraines were under relatively good control with nortriptyline and Maxalt.  Overall she does feel that she is getting better since her migraine on March 23.  She returns today for an evaluation.  HISTORY 10/14/17: Cassandra Zuniga is a 47 year old right-handed white female with a history of migraine headache.  The patient was given was placed on nortriptyline on the last visit which seems to have helped her headache.  She has not had any migraine in over 1 month.  However, she is having significant constipation issues on 20 mg at night on the nortriptyline, she has cut back to 10 mg at night within the last 10 days.  She is taking docusate but she is not on any laxatives for the constipation.  She has been under a lot of stress recently as her daughter was in a motor vehicle accident.  She has an underlying panic  disorder, she has had 3 panic attacks within the last week.  She has gone to the emergency room previously with chest pain associated with anxiety.  In the past she has been on Lexapro in the past but she stopped the medication as she learned about the potential for serotonin syndrome associated with Maxalt use.  The patient was given Ativan to the emergency room.  She returns to the office today for an evaluation.  In the past she has complained of memory problems, this has not progressed over time.  The patient is thinking about going back to work, she has not worked in several years.    REVIEW OF SYSTEMS: Out of a complete 14 system review of symptoms, the patient complains only of the following symptoms, and all other reviewed systems are negative.  Chest pain, neck pain, nervous/anxious, memory loss  ALLERGIES: Allergies  Allergen Reactions  . Erythromycin Hives  . Percocet [Oxycodone-Acetaminophen] Itching  . Phenergan [Promethazine Hcl] Other (See Comments)    Red lines up arm  . Sulfa Antibiotics Nausea And Vomiting    HOME MEDICATIONS: Outpatient Medications Prior to Visit  Medication Sig Dispense Refill  . albuterol (PROVENTIL HFA;VENTOLIN HFA) 108 (90 BASE) MCG/ACT inhaler Inhale 2 puffs into the lungs every 6 (six) hours as needed for wheezing or shortness of breath.    . cycloSPORINE (RESTASIS) 0.05 % ophthalmic  emulsion Place 1 drop into both eyes 2 (two) times daily.    Marland Kitchen escitalopram (LEXAPRO) 10 MG tablet Take 1 tablet (10 mg total) by mouth daily. 30 tablet 3  . fluticasone (FLONASE) 50 MCG/ACT nasal spray SHAKE LQ AND U 2 SPRAYS NASALLY QD UTD  5  . levothyroxine (SYNTHROID, LEVOTHROID) 112 MCG tablet Take 112 mcg by mouth daily before breakfast.    . Multiple Vitamin (MULTIVITAMIN) tablet Take 1 tablet by mouth 2 (two) times daily.    . nortriptyline (PAMELOR) 10 MG capsule Take 1 capsule (10 mg total) by mouth at bedtime. 60 capsule 5  . rizatriptan (MAXALT-MLT) 10  MG disintegrating tablet   3   Facility-Administered Medications Prior to Visit  Medication Dose Route Frequency Provider Last Rate Last Dose  . triamcinolone acetonide (KENALOG) 10 MG/ML injection 10 mg  10 mg Other Once Asencion Islam, DPM        PAST MEDICAL HISTORY: Past Medical History:  Diagnosis Date  . Asthma   . Classic migraine with aura 05/23/2015  . Diverticulitis   . GERD (gastroesophageal reflux disease)   . Hypothyroidism   . Intermittent asthma   . Kidney infection   . Kidney stones   . Migraine   . Osteoporosis   . Panic disorder 10/14/2017  . Scoliosis   . Seasonal allergies   . Seizures (HCC)   . Thyroid disease     PAST SURGICAL HISTORY: Past Surgical History:  Procedure Laterality Date  . APPENDECTOMY    . CERVICAL DISC SURGERY    . COLON SURGERY     Diverticula was removed  . KNEE SURGERY     bilateral  . THYROGLOSSAL DUCT CYST    . TONSILLECTOMY    . TUBAL LIGATION      FAMILY HISTORY: Family History  Problem Relation Age of Onset  . GI Disease Mother        Gastroparesis  . Hypertension Mother   . Migraines Mother   . Hypothyroidism Mother   . Migraines Sister   . Diabetes Other   . Cancer Other   . Angina Paternal Grandfather   . Diabetes Paternal Grandfather   . Stroke Paternal Grandfather   . Cancer Maternal Aunt   . Cancer Maternal Uncle   . Stroke Maternal Grandmother   . Hypertension Maternal Grandfather   . Diabetes Paternal Grandmother   . Stroke Paternal Grandmother   . Hypothyroidism Son     SOCIAL HISTORY: Social History   Socioeconomic History  . Marital status: Single    Spouse name: Not on file  . Number of children: 2  . Years of education: assoc.  . Highest education level: Not on file  Occupational History  . Not on file  Social Needs  . Financial resource strain: Not on file  . Food insecurity:    Worry: Not on file    Inability: Not on file  . Transportation needs:    Medical: Not on file     Non-medical: Not on file  Tobacco Use  . Smoking status: Former Smoker    Last attempt to quit: 07/17/1989    Years since quitting: 28.4  . Smokeless tobacco: Never Used  Substance and Sexual Activity  . Alcohol use: Yes    Alcohol/week: 0.0 oz    Comment: very rarely  . Drug use: No  . Sexual activity: Yes    Birth control/protection: Surgical  Lifestyle  . Physical activity:    Days per week:  Not on file    Minutes per session: Not on file  . Stress: Not on file  Relationships  . Social connections:    Talks on phone: Not on file    Gets together: Not on file    Attends religious service: Not on file    Active member of club or organization: Not on file    Attends meetings of clubs or organizations: Not on file    Relationship status: Not on file  . Intimate partner violence:    Fear of current or ex partner: Not on file    Emotionally abused: Not on file    Physically abused: Not on file    Forced sexual activity: Not on file  Other Topics Concern  . Not on file  Social History Narrative   ** Merged History Encounter **   Lives at home w/ her boyfriend and 2 children   Patient drinks about 1-2 cups of caffeine daily.   Patient is right handed.      PHYSICAL EXAM  Vitals:   12/09/17 0731  BP: 131/81  Pulse: 66  Weight: 142 lb 12.8 oz (64.8 kg)  Height: 5\' 6"  (1.676 m)   Body mass index is 23.05 kg/m.  Generalized: Well developed, in no acute distress   Neurological examination  Mentation: Alert oriented to time, place, history taking. Follows all commands speech and language fluent Cranial nerve II-XII: Pupils were equal round reactive to light. Extraocular movements were full, visual field were full on confrontational test. Facial sensation and strength were normal. Uvula tongue midline. Head turning and shoulder shrug  were normal and symmetric. Motor: The motor testing reveals 5 over 5 strength of all 4 extremities. Good symmetric motor tone is noted  throughout.  Sensory: Sensory testing is intact to soft touch on all 4 extremities. No evidence of extinction is noted.  Coordination: Cerebellar testing reveals good finger-nose-finger and heel-to-shin bilaterally.  Gait and station: Gait is normal. Tandem gait is normal. Romberg is negative. No drift is seen.  Reflexes: Deep tendon reflexes are symmetric and normal bilaterally.   DIAGNOSTIC DATA (LABS, IMAGING, TESTING) - I reviewed patient records, labs, notes, testing and imaging myself where available.  Lab Results  Component Value Date   WBC 13.8 (H) 11/24/2017   HGB 13.2 11/24/2017   HCT 40.1 11/24/2017   MCV 87.6 11/24/2017   PLT 268 11/24/2017      Component Value Date/Time   NA 138 11/24/2017 2121   NA 141 12/24/2013 0008   K 4.1 11/24/2017 2121   K 4.0 12/24/2013 0008   CL 103 11/24/2017 2121   CL 106 12/24/2013 0008   CO2 26 11/24/2017 2121   CO2 29 12/24/2013 0008   GLUCOSE 97 11/24/2017 2121   GLUCOSE 91 12/24/2013 0008   BUN 8 11/24/2017 2121   BUN 11 12/24/2013 0008   CREATININE 0.63 11/24/2017 2121   CREATININE 0.77 12/24/2013 0008   CALCIUM 9.4 11/24/2017 2121   CALCIUM 9.3 12/24/2013 0008   PROT 7.5 11/24/2017 2121   PROT 7.1 03/12/2016 0855   PROT 8.1 12/24/2013 0008   ALBUMIN 3.9 11/24/2017 2121   ALBUMIN 4.5 03/12/2016 0855   ALBUMIN 4.3 12/24/2013 0008   AST 63 (H) 11/24/2017 2121   AST 22 12/24/2013 0008   ALT 79 (H) 11/24/2017 2121   ALT 21 12/24/2013 0008   ALKPHOS 67 11/24/2017 2121   ALKPHOS 65 12/24/2013 0008   BILITOT 0.2 (L) 11/24/2017 2121   BILITOT <0.2  03/12/2016 0855   BILITOT 0.2 12/24/2013 0008   GFRNONAA >60 11/24/2017 2121   GFRNONAA >60 12/24/2013 0008   GFRAA >60 11/24/2017 2121   GFRAA >60 12/24/2013 0008   Lab Results  Component Value Date   VITAMINB12 357 03/18/2016   Lab Results  Component Value Date   TSH 7.220 (H) 03/18/2016      ASSESSMENT AND PLAN 47 y.o. year old female  has a past medical history of  Asthma, Classic migraine with aura (05/23/2015), Diverticulitis, GERD (gastroesophageal reflux disease), Hypothyroidism, Intermittent asthma, Kidney infection, Kidney stones, Migraine, Osteoporosis, Panic disorder (10/14/2017), Scoliosis, Seasonal allergies, Seizures (HCC), and Thyroid disease. here with:  1.  Migraine headaches  Overall the patient is doing better since her migraine on the 23rd.  For now she will continue on nortriptyline and Maxalt.  If her headache frequency or severity worsen she will let us know.  If her headaches do not respond to Maxalt we may need to change this to another triptan in the future.  She voiced understanding.  She will follow-up in 1 year or sooner if needed.   I spent 15 minutes with the patient. 50% of this time was spent discussing her medication   Butch PennyMegan Andreal Vultaggio, MSN, NP-C 12/09/2017, 7:55 AM [facility] [address], [address] [place], [place] [address] [phone]

## 2017-12-09 NOTE — Patient Instructions (Signed)
Your Plan:  Continue nortriptyline and Maxalt If headache severity or frequency worsens please let us know  To prevent or relieve headaches, try the following: Cool Compress. Lie down and place a cool compress on your head.  Avoid headache triggers. If certain foods or odors seem to have triggered your migraines in the past, avoid them. A headache diary might help you identify triggers.  Include physical activity in your daily routine. Try a daily walk or other moderate aerobic exercise.  Manage stress. Find healthy ways to cope with the stressors, such as delegating tasks on your to-do list.  Practice relaxation techniques. Try deep breathing, yoga, massage and visualization.  Eat regularly. Eating regularly scheduled meals and maintaining a healthy diet might help prevent headaches. Also, drink plenty of fluids.  Follow a regular sleep schedule. Sleep deprivation might contribute to headaches Consider biofeedback. With this mind-body technique, you learn to control certain bodily functions - such as muscle tension, heart rate and blood pressure - to prevent headaches or reduce headache pain.    Proceed to emergency room if you experience new or worsening symptoms or symptoms do not resolve, if you have new neurologic symptoms or if headache is severe, or for any concerning symptom.   Provided education and documentation from American headache Society toolbox including articles on: chronic migraine medication overuse headache, chronic migraines, prevention of migraines, behavioral and other nonpharmacologic treatments for headache.   Thank you for coming to see [facility] at [facility]. I hope we have been able to provide you high quality care today.  You may receive a patient satisfaction survey over the next few weeks. We would appreciate your feedback and comments so that we may continue to improve ourselves and the health of our patients.

## 2018-08-02 ENCOUNTER — Other Ambulatory Visit: Payer: Self-pay

## 2018-08-02 ENCOUNTER — Emergency Department
Admission: EM | Admit: 2018-08-02 | Discharge: 2018-08-02 | Disposition: A | Discharge status: Home / Self Care | Payer: Medicaid Other | Attending: Emergency Medicine | Admitting: Emergency Medicine

## 2018-08-02 DIAGNOSIS — J45909 Unspecified asthma, uncomplicated: Secondary | ICD-10-CM | POA: Insufficient documentation

## 2018-08-02 DIAGNOSIS — M419 Scoliosis, unspecified: Secondary | ICD-10-CM | POA: Insufficient documentation

## 2018-08-02 DIAGNOSIS — E039 Hypothyroidism, unspecified: Secondary | ICD-10-CM | POA: Insufficient documentation

## 2018-08-02 DIAGNOSIS — Z87891 Personal history of nicotine dependence: Secondary | ICD-10-CM | POA: Insufficient documentation

## 2018-08-02 DIAGNOSIS — Z79899 Other long term (current) drug therapy: Secondary | ICD-10-CM | POA: Insufficient documentation

## 2018-08-02 DIAGNOSIS — J069 Acute upper respiratory infection, unspecified: Secondary | ICD-10-CM | POA: Insufficient documentation

## 2018-08-02 MED ORDER — BENZONATATE 100 MG PO CAPS: 100.0000 mg | ORAL_CAPSULE | Freq: Three times a day (TID) | ORAL | 0 refills | Status: AC | PRN

## 2018-08-02 NOTE — ED Notes (Signed)
Pt  Reports  Symptoms  Cough runny nose  Scratchy throat   The  cough is  Dry  With green phlegm  Symptpms started last pm

## 2018-08-02 NOTE — ED Triage Notes (Signed)
Pt c/o cough with runny nose and sore throat that started this morning when she woke up.

## 2018-08-02 NOTE — ED Provider Notes (Signed)
[facility] Emergency Department Provider Note  ____________________________________________  Time seen: Approximately 4:30 PM  I have reviewed the triage vital signs and the nursing notes.   HISTORY  Chief Complaint URI    HPI Cassandra BrunnerJulia A Zuniga is a 47 y.o. female presents to the ED with cough, rhinorrhea, congestion, chills and low-grade fever that started today.  Patient reports that she did have a "scratchy throat" yesterday.  Patient has had less appetite today.  Patient has had 1-2 episodes of emesis since incident occurred.  No diarrhea.  Patient denies chest pain, chest tightness, shortness of breath or abdominal pain.  Patient reports that her son had similar symptoms earlier in the week. No alleviating measures have been attempted.   Past Medical History:  Diagnosis Date  . Asthma   . Classic migraine with aura 05/23/2015  . Diverticulitis   . GERD (gastroesophageal reflux disease)   . Hypothyroidism   . Intermittent asthma   . Kidney infection   . Kidney stones   . Migraine   . Osteoporosis   . Panic disorder 10/14/2017  . Scoliosis   . Seasonal allergies   . Seizures (HCC)   . Thyroid disease     Patient Active Problem List   Diagnosis Date Noted  . Panic disorder 10/14/2017  . Memory change 03/18/2016  . Classic migraine with aura 05/23/2015  . Chest pain 04/23/2014  . Migraine 04/23/2014  . Hypothyroidism 04/23/2014  . Bradycardia 04/23/2014  . Intermittent asthma 04/23/2014    Past Surgical History:  Procedure Laterality Date  . APPENDECTOMY    . CERVICAL DISC SURGERY    . COLON SURGERY     Diverticula was removed  . KNEE SURGERY     bilateral  . THYROGLOSSAL DUCT CYST    . TONSILLECTOMY    . TUBAL LIGATION      Prior to Admission medications   Medication Sig Start Date End Date Taking? Authorizing Provider  albuterol (PROVENTIL HFA;VENTOLIN HFA) 108 (90 BASE) MCG/ACT inhaler Inhale 2 puffs into the lungs every 6 (six)  hours as needed for wheezing or shortness of breath.    [provider]  benzonatate (TESSALON PERLES) 100 MG capsule Take 1 capsule (100 mg total) by mouth 3 (three) times daily as needed for up to 7 days for cough. 08/02/18 08/09/18  Orvil FeilWoods, Deseri Loss M, PA-C  cycloSPORINE (RESTASIS) 0.05 % ophthalmic emulsion Place 1 drop into both eyes 2 (two) times daily.    [provider]  escitalopram (LEXAPRO) 10 MG tablet Take 1 tablet (10 mg total) by mouth daily. 10/14/17   York SpanielWillis, Charles K, MD  fluticasone (FLONASE) 50 MCG/ACT nasal spray SHAKE LQ AND U 2 SPRAYS NASALLY QD UTD 10/18/15   [provider]  levothyroxine (SYNTHROID, LEVOTHROID) 112 MCG tablet Take 112 mcg by mouth daily before breakfast.    [provider]  Multiple Vitamin (MULTIVITAMIN) tablet Take 1 tablet by mouth 2 (two) times daily.    [provider]  nortriptyline (PAMELOR) 10 MG capsule Take 1 capsule (10 mg total) by mouth at bedtime. 10/14/17   York SpanielWillis, Charles K, MD  rizatriptan (MAXALT-MLT) 10 MG disintegrating tablet  08/23/15   [provider]    Allergies Erythromycin; Percocet [oxycodone-acetaminophen]; Phenergan [promethazine hcl]; and Sulfa antibiotics  Family History  Problem Relation Age of Onset  . GI Disease Mother        Gastroparesis  . Hypertension Mother   . Migraines Mother   . Hypothyroidism Mother   .  Migraines Sister   . Diabetes Other   . Cancer Other   . Angina Paternal Grandfather   . Diabetes Paternal Grandfather   . Stroke Paternal Grandfather   . Cancer Maternal Aunt   . Cancer Maternal Uncle   . Stroke Maternal Grandmother   . Hypertension Maternal Grandfather   . Diabetes Paternal Grandmother   . Stroke Paternal Grandmother   . Hypothyroidism Son     Social History Social History   Tobacco Use  . Smoking status: Former Smoker    Last attempt to quit: 07/17/1989    Years since quitting: 29.0  . Smokeless tobacco: Never Used   Substance Use Topics  . Alcohol use: Yes    Alcohol/week: 0.0 standard drinks    Comment: very rarely  . Drug use: No      Review of Systems  Constitutional: Patient has fever.  Eyes: No visual changes. No discharge ENT: Patient has congestion.  Cardiovascular: no chest pain. Respiratory: Patient has cough.  Gastrointestinal: No abdominal pain.  No nausea, no vomiting. Patient had diarrhea.  Genitourinary: Negative for dysuria. No hematuria Musculoskeletal: Patient has myalgias.  Skin: Negative for rash, abrasions, lacerations, ecchymosis. Neurological: Patient has headache, no focal weakness or numbness.    ____________________________________________   PHYSICAL EXAM:  VITAL SIGNS: ED Triage Vitals  Enc Vitals Group     BP 08/02/18 1456 (!) 143/84     Pulse Rate 08/02/18 1456 77     Resp 08/02/18 1456 18     Temp 08/02/18 1456 98.5 F (36.9 C)     Temp Source 08/02/18 1456 Oral     SpO2 08/02/18 1456 98 %     Weight 08/02/18 1458 134 lb (60.8 kg)     Height 08/02/18 1458 5\' 6"  (1.676 m)     Head Circumference --      Peak Flow --      Pain Score 08/02/18 1457 0     Pain Loc --      Pain Edu? --      Excl. in GC? --      Constitutional: Alert and oriented. Patient is lying supine. Eyes: Conjunctivae are normal. PERRL. EOMI. Head: Atraumatic. ENT:      Ears: Tympanic membranes are mildly injected with mild effusion bilaterally.       Nose: No congestion/rhinnorhea.      Mouth/Throat: Mucous membranes are moist. Posterior pharynx is mildly erythematous.  Hematological/Lymphatic/Immunilogical: No cervical lymphadenopathy.  Cardiovascular: Normal rate, regular rhythm. Normal S1 and S2.  Good peripheral circulation. Respiratory: Normal respiratory effort without tachypnea or retractions. Lungs CTAB. Good air entry to the bases with no decreased or absent breath sounds. Gastrointestinal: Bowel sounds 4 quadrants. Soft and nontender to palpation. No guarding or  rigidity. No palpable masses. No distention. No CVA tenderness. Musculoskeletal: Full range of motion to all extremities. No gross deformities appreciated. Neurologic:  Normal speech and language. No gross focal neurologic deficits are appreciated.  Skin:  Skin is warm, dry and intact. No rash noted. Psychiatric: Mood and affect are normal. Speech and behavior are normal. Patient exhibits appropriate insight and judgement.   ____________________________________________   LABS (all labs ordered are listed, but only abnormal results are displayed)  Labs Reviewed - No data to display ____________________________________________  EKG   ____________________________________________  RADIOLOGY   No results found.  ____________________________________________    PROCEDURES  Procedure(s) performed:    Procedures    Medications - No data to display   ____________________________________________  INITIAL IMPRESSION / ASSESSMENT AND PLAN / ED COURSE  Pertinent labs & imaging results that were available during my care of the patient were reviewed by me and considered in my medical decision making (see chart for details).  Review of the  [place] was performed in accordance of the [place] prior to dispensing any controlled drugs.      Assessment and plan Viral URI Patient presents to the emergency department with rhinorrhea, congestion, nonproductive cough and chills that started today.  History and physical exam findings are consistent with an unspecified viral URI.  Rest and hydration were encouraged.  Patient was advised to follow-up with primary care as needed.  All patient questions were answered.    ____________________________________________  FINAL CLINICAL IMPRESSION(S) / ED DIAGNOSES  Final diagnoses:  Viral upper respiratory tract infection      NEW MEDICATIONS STARTED DURING THIS VISIT:  ED Discharge Orders         Ordered    benzonatate (TESSALON  PERLES) 100 MG capsule  3 times daily PRN     08/02/18 1558              This chart was dictated using voice recognition software/Dragon. Despite best efforts to proofread, errors can occur which can change the meaning. Any change was purely unintentional.    Orvil Feil, PA-C 08/02/18 1639    Sharyn Creamer, MD 08/03/18 [phone]

## 2018-08-13 ENCOUNTER — Encounter: Payer: Self-pay | Admitting: Emergency Medicine

## 2018-08-13 ENCOUNTER — Emergency Department
Admission: EM | Admit: 2018-08-13 | Discharge: 2018-08-13 | Disposition: A | Discharge status: Home / Self Care | Payer: Medicaid Other | Attending: Emergency Medicine | Admitting: Emergency Medicine

## 2018-08-13 ENCOUNTER — Other Ambulatory Visit: Payer: Self-pay

## 2018-08-13 DIAGNOSIS — J45909 Unspecified asthma, uncomplicated: Secondary | ICD-10-CM | POA: Insufficient documentation

## 2018-08-13 DIAGNOSIS — Z87891 Personal history of nicotine dependence: Secondary | ICD-10-CM | POA: Insufficient documentation

## 2018-08-13 DIAGNOSIS — E039 Hypothyroidism, unspecified: Secondary | ICD-10-CM | POA: Insufficient documentation

## 2018-08-13 DIAGNOSIS — J060 Acute laryngopharyngitis: Secondary | ICD-10-CM

## 2018-08-13 DIAGNOSIS — Z79899 Other long term (current) drug therapy: Secondary | ICD-10-CM | POA: Insufficient documentation

## 2018-08-13 DIAGNOSIS — J040 Acute laryngitis: Secondary | ICD-10-CM | POA: Insufficient documentation

## 2018-08-13 DIAGNOSIS — J029 Acute pharyngitis, unspecified: Secondary | ICD-10-CM | POA: Insufficient documentation

## 2018-08-13 LAB — GROUP A STREP BY PCR: GROUP A STREP BY PCR: NOT DETECTED

## 2018-08-13 MED ORDER — METHYLPREDNISOLONE SODIUM SUCC 125 MG IJ SOLR
125.0000 mg | Freq: Once | INTRAMUSCULAR | Status: AC
  Administered 2018-08-13: 125 mg via INTRAMUSCULAR
  Filled 2018-08-13: qty 2

## 2018-08-13 MED ORDER — PHENOL 1.4 % MT LIQD: 1.0000 | OROMUCOSAL | 0 refills | Status: DC | PRN

## 2018-08-13 MED ORDER — METHYLPREDNISOLONE 4 MG PO TBPK: ORAL_TABLET | ORAL | 0 refills | Status: DC

## 2018-08-13 MED ORDER — BACITRACIN-NEOMYCIN-POLYMYXIN 400-5-5000 EX OINT: TOPICAL_OINTMENT | Freq: Once | CUTANEOUS | Status: DC

## 2018-08-13 NOTE — ED Notes (Signed)
Patient says woke up today with sore throat and hurts to turn neck from right side of throat to right ear.  Also lost her voice.  Throat is sl red and worse on right without exudate or swelling.  Says it also hurts to swallow.

## 2018-08-13 NOTE — Discharge Instructions (Addendum)
Your strep test was negative.  Voice rest and follow discharge care instructions.  Take medication as directed.

## 2018-08-13 NOTE — ED Notes (Signed)
Laying on Doctor, general practicestretcher.  Pain unchanged.

## 2018-08-13 NOTE — ED Provider Notes (Signed)
[facility] Emergency Department Provider Note   ____________________________________________   First MD Initiated Contact with Patient 08/13/18 0913     (approximate)  I have reviewed the triage vital signs and the nursing notes.   HISTORY  Chief Complaint Sore Throat; Neck Pain; and Hoarse    HPI Cassandra Zuniga is a 47 y.o. female patient presents with sore throat, decreased voice volume, and neck pain.  Patient states this occurred with a.m. awakening.  Patient was seen in this facility 11 days ago and diagnosed with upper respiratory infection.  Patient states she continues to have cough.   Past Medical History:  Diagnosis Date  . Asthma   . Classic migraine with aura 05/23/2015  . Diverticulitis   . GERD (gastroesophageal reflux disease)   . Hypothyroidism   . Intermittent asthma   . Kidney infection   . Kidney stones   . Migraine   . Osteoporosis   . Panic disorder 10/14/2017  . Scoliosis   . Seasonal allergies   . Seizures (HCC)   . Thyroid disease     Patient Active Problem List   Diagnosis Date Noted  . Panic disorder 10/14/2017  . Memory change 03/18/2016  . Classic migraine with aura 05/23/2015  . Chest pain 04/23/2014  . Migraine 04/23/2014  . Hypothyroidism 04/23/2014  . Bradycardia 04/23/2014  . Intermittent asthma 04/23/2014    Past Surgical History:  Procedure Laterality Date  . APPENDECTOMY    . CERVICAL DISC SURGERY    . COLON SURGERY     Diverticula was removed  . KNEE SURGERY     bilateral  . THYROGLOSSAL DUCT CYST    . TONSILLECTOMY    . TUBAL LIGATION      Prior to Admission medications   Medication Sig Start Date End Date Taking? Authorizing Provider  albuterol (PROVENTIL HFA;VENTOLIN HFA) 108 (90 BASE) MCG/ACT inhaler Inhale 2 puffs into the lungs every 6 (six) hours as needed for wheezing or shortness of breath.   Yes [provider]  cycloSPORINE (RESTASIS) 0.05 % ophthalmic emulsion Place  1 drop into both eyes 2 (two) times daily.   Yes [provider]  levothyroxine (SYNTHROID, LEVOTHROID) 112 MCG tablet Take 112 mcg by mouth daily before breakfast.   Yes [provider]  nortriptyline (PAMELOR) 10 MG capsule Take 1 capsule (10 mg total) by mouth at bedtime. 10/14/17  Yes York Spaniel, MD  rizatriptan (MAXALT-MLT) 10 MG disintegrating tablet  08/23/15  Yes [provider]  escitalopram (LEXAPRO) 10 MG tablet Take 1 tablet (10 mg total) by mouth daily. 10/14/17   York Spaniel, MD  fluticasone (FLONASE) 50 MCG/ACT nasal spray SHAKE LQ AND U 2 SPRAYS NASALLY QD UTD 10/18/15   [provider]  methylPREDNISolone (MEDROL DOSEPAK) 4 MG TBPK tablet Take Tapered dose as directed 08/13/18   Joni Reining, PA-C  Multiple Vitamin (MULTIVITAMIN) tablet Take 1 tablet by mouth 2 (two) times daily.    [provider]  phenol (CHLORASEPTIC MOUTH PAIN) 1.4 % LIQD Use as directed 1 spray in the mouth or throat as needed for throat irritation / pain. 08/13/18   Joni Reining, PA-C    Allergies Erythromycin; Percocet [oxycodone-acetaminophen]; Phenergan [promethazine hcl]; and Sulfa antibiotics  Family History  Problem Relation Age of Onset  . GI Disease Mother        Gastroparesis  . Hypertension Mother   . Migraines Mother   . Hypothyroidism Mother   .  Migraines Sister   . Diabetes Other   . Cancer Other   . Angina Paternal Grandfather   . Diabetes Paternal Grandfather   . Stroke Paternal Grandfather   . Cancer Maternal Aunt   . Cancer Maternal Uncle   . Stroke Maternal Grandmother   . Hypertension Maternal Grandfather   . Diabetes Paternal Grandmother   . Stroke Paternal Grandmother   . Hypothyroidism Son     Social History Social History   Tobacco Use  . Smoking status: Former Smoker    Last attempt to quit: 07/17/1989    Years since quitting: 29.0  . Smokeless tobacco: Never Used  Substance Use Topics  . Alcohol use:  Yes    Alcohol/week: 0.0 standard drinks    Comment: very rarely  . Drug use: No    Review of Systems  Constitutional: No fever/chills Eyes: No visual changes. ENT: No sore throat. Cardiovascular: Denies chest pain. Respiratory: Denies shortness of breath. Gastrointestinal: No abdominal pain.  No nausea, no vomiting.  No diarrhea.  No constipation. Genitourinary: Negative for dysuria. Musculoskeletal: Negative for back pain. Skin: Negative for rash. Neurological: Negative for headaches, focal weakness or numbness. Psychiatric:Anxiety Endocrine:Hypothyroidism Allergic/Immunilogical: See medication set. ____________________________________________   PHYSICAL EXAM:   VITAL SIGNS: ED Triage Vitals  Enc Vitals Group     BP 08/13/18 0857 (!) 142/78     Pulse Rate 08/13/18 0857 70     Resp 08/13/18 0857 16     Temp 08/13/18 0857 97.8 F (36.6 C)     Temp Source 08/13/18 0857 Oral     SpO2 08/13/18 0857 97 %     Weight 08/13/18 0858 134 lb (60.8 kg)     Height 08/13/18 0858 5\' 6"  (1.676 m)     Head Circumference --      Peak Flow --      Pain Score 08/13/18 0858 6     Pain Loc --      Pain Edu? --      Excl. in GC? --    Constitutional: Alert and oriented. Well appearing and in no acute distress. Mouth/Throat: Mucous membranes are moist.  Oropharynx erythematous.  Decreased voice volume Neck: No stridor.  Hematological/Lymphatic/Immunilogical: No cervical lymphadenopathy. Cardiovascular: Normal rate, regular rhythm. Grossly normal heart sounds.  Good peripheral circulation. Respiratory: Normal respiratory effort.  No retractions. Lungs CTAB. Gastrointestinal: Soft and nontender. No distention. No abdominal bruits. No CVA tenderness. Musculoskeletal: No lower extremity tenderness nor edema.  No joint effusions. Neurologic:  Normal speech and language. No gross focal neurologic deficits are appreciated. No gait instability. Skin:  Skin is warm, dry and intact. No rash  noted. Psychiatric: Mood and affect are normal. Speech and behavior are normal.  ____________________________________________   LABS (all labs ordered are listed, but only abnormal results are displayed)  Labs Reviewed  GROUP A STREP BY PCR   ____________________________________________  EKG   ____________________________________________  RADIOLOGY  ED MD interpretation:    Official radiology report(s): No results found.  ____________________________________________   PROCEDURES  Procedure(s) performed: None  Procedures  Critical Care performed: No  ____________________________________________   INITIAL IMPRESSION / ASSESSMENT AND PLAN / ED COURSE  As part of my medical decision making, I reviewed the following data within the electronic MEDICAL RECORD NUMBER    Patient presents with sore throat and decreased voice volume consistent with laryngitis.  Patient strep test was negative.  Patient given discharge care instruction advised take medication as directed.  ____________________________________________   FINAL CLINICAL IMPRESSION(S) / ED DIAGNOSES  Final diagnoses:  Sore throat and laryngitis     ED Discharge Orders         Ordered    methylPREDNISolone (MEDROL DOSEPAK) 4 MG TBPK tablet     08/13/18 1022    phenol (CHLORASEPTIC MOUTH PAIN) 1.4 % LIQD  As needed     08/13/18 1022           Note:  This document was prepared using Dragon voice recognition software and may include unintentional dictation errors.    Joni Reining, PA-C 08/13/18 1032    Jene Every, MD 08/13/18 [phone]

## 2018-08-13 NOTE — ED Triage Notes (Signed)
Patient ambulatory to triage complaining of hoarseness, sore throat, and neck pain.  States she woke up like this.  Seen here 11/17 with "upper respiratory problem" and states she still has a cough.  Afebrile.

## 2018-10-11 ENCOUNTER — Emergency Department (HOSPITAL_COMMUNITY): Payer: Medicaid Other

## 2018-10-11 ENCOUNTER — Emergency Department (HOSPITAL_COMMUNITY)
Admission: EM | Admit: 2018-10-11 | Discharge: 2018-10-11 | Disposition: A | Discharge status: Home / Self Care | Payer: Medicaid Other | Attending: Emergency Medicine | Admitting: Emergency Medicine

## 2018-10-11 ENCOUNTER — Other Ambulatory Visit: Payer: Self-pay

## 2018-10-11 DIAGNOSIS — Z87891 Personal history of nicotine dependence: Secondary | ICD-10-CM | POA: Insufficient documentation

## 2018-10-11 DIAGNOSIS — S80211A Abrasion, right knee, initial encounter: Secondary | ICD-10-CM | POA: Insufficient documentation

## 2018-10-11 DIAGNOSIS — Y998 Other external cause status: Secondary | ICD-10-CM | POA: Insufficient documentation

## 2018-10-11 DIAGNOSIS — Y9355 Activity, bike riding: Secondary | ICD-10-CM | POA: Insufficient documentation

## 2018-10-11 DIAGNOSIS — Z79899 Other long term (current) drug therapy: Secondary | ICD-10-CM | POA: Insufficient documentation

## 2018-10-11 DIAGNOSIS — S8012XA Contusion of left lower leg, initial encounter: Secondary | ICD-10-CM | POA: Insufficient documentation

## 2018-10-11 DIAGNOSIS — J45909 Unspecified asthma, uncomplicated: Secondary | ICD-10-CM | POA: Insufficient documentation

## 2018-10-11 DIAGNOSIS — Y92480 Sidewalk as the place of occurrence of the external cause: Secondary | ICD-10-CM | POA: Insufficient documentation

## 2018-10-11 DIAGNOSIS — E039 Hypothyroidism, unspecified: Secondary | ICD-10-CM | POA: Insufficient documentation

## 2018-10-11 MED ORDER — ACETAMINOPHEN 325 MG PO TABS: 650.0000 mg | ORAL_TABLET | Freq: Once | ORAL | Status: DC
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
## 2018-10-11 NOTE — Discharge Instructions (Addendum)
X-rays reveal no signs of fracture in the right knee. Please keep wound clean and dry Follow-up with your doctor if continued pain after 2 to 3 days.
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
## 2018-10-15 ENCOUNTER — Ambulatory Visit: Payer: Medicaid Other | Admitting: Adult Health

## 2018-11-05 ENCOUNTER — Emergency Department: Payer: Self-pay

## 2018-11-05 ENCOUNTER — Other Ambulatory Visit: Payer: Self-pay

## 2018-11-05 ENCOUNTER — Emergency Department
Admission: EM | Admit: 2018-11-05 | Discharge: 2018-11-05 | Disposition: A | Discharge status: Home / Self Care | Payer: Self-pay | Attending: Emergency Medicine | Admitting: Emergency Medicine

## 2018-11-05 ENCOUNTER — Encounter: Payer: Self-pay | Admitting: Emergency Medicine

## 2018-11-05 DIAGNOSIS — Z79899 Other long term (current) drug therapy: Secondary | ICD-10-CM | POA: Insufficient documentation

## 2018-11-05 DIAGNOSIS — Y998 Other external cause status: Secondary | ICD-10-CM | POA: Insufficient documentation

## 2018-11-05 DIAGNOSIS — Y9301 Activity, walking, marching and hiking: Secondary | ICD-10-CM | POA: Insufficient documentation

## 2018-11-05 DIAGNOSIS — J45909 Unspecified asthma, uncomplicated: Secondary | ICD-10-CM | POA: Insufficient documentation

## 2018-11-05 DIAGNOSIS — Y9289 Other specified places as the place of occurrence of the external cause: Secondary | ICD-10-CM | POA: Insufficient documentation

## 2018-11-05 DIAGNOSIS — S8002XA Contusion of left knee, initial encounter: Secondary | ICD-10-CM | POA: Insufficient documentation

## 2018-11-05 DIAGNOSIS — Z87891 Personal history of nicotine dependence: Secondary | ICD-10-CM | POA: Insufficient documentation

## 2018-11-05 DIAGNOSIS — J069 Acute upper respiratory infection, unspecified: Secondary | ICD-10-CM | POA: Insufficient documentation

## 2018-11-05 DIAGNOSIS — W010XXA Fall on same level from slipping, tripping and stumbling without subsequent striking against object, initial encounter: Secondary | ICD-10-CM | POA: Insufficient documentation

## 2018-11-05 DIAGNOSIS — B9789 Other viral agents as the cause of diseases classified elsewhere: Secondary | ICD-10-CM

## 2018-11-05 LAB — INFLUENZA PANEL BY PCR (TYPE A & B)
INFLAPCR: NEGATIVE
INFLBPCR: NEGATIVE

## 2018-11-05 MED ORDER — LIDOCAINE 5 % EX PTCH
1.0000 | MEDICATED_PATCH | CUTANEOUS | Status: DC
  Administered 2018-11-05: 1 via TRANSDERMAL
  Filled 2018-11-05: qty 1

## 2018-11-05 MED ORDER — IBUPROFEN 600 MG PO TABS: 600.0000 mg | ORAL_TABLET | Freq: Three times a day (TID) | ORAL | 0 refills | Status: DC | PRN

## 2018-11-05 MED ORDER — PSEUDOEPH-BROMPHEN-DM 30-2-10 MG/5ML PO SYRP: 5.0000 mL | ORAL_SOLUTION | Freq: Four times a day (QID) | ORAL | 0 refills | Status: DC | PRN

## 2018-11-05 NOTE — ED Provider Notes (Signed)
[facility] Emergency Department Provider Note   ____________________________________________   First MD Initiated Contact with Patient 11/05/18 [phone]     (approximate)  I have reviewed the triage vital signs and the nursing notes.   HISTORY  Chief Complaint Knee Pain and Cough    HPI Cassandra Zuniga is a 48 y.o. female patient complains of left knee pain secondary to fall 5 days ago.  Patient states she is concerned because he has been previous surgery to the knee.  Patient states the past couple days she is having trouble extending the knee.  Patient state mild relief with ice and anti-inflammatory compression.  Patient also complained of flulike symptoms consisting of cough, congestion, chills and body aches.  Onset of complaint was yesterday.  Patient not taken flu shot for this season.    Past Medical History:  Diagnosis Date  . Asthma   . Classic migraine with aura 05/23/2015  . Diverticulitis   . GERD (gastroesophageal reflux disease)   . Hypothyroidism   . Intermittent asthma   . Kidney infection   . Kidney stones   . Migraine   . Osteoporosis   . Panic disorder 10/14/2017  . Scoliosis   . Seasonal allergies   . Seizures (HCC)   . Thyroid disease     Patient Active Problem List   Diagnosis Date Noted  . Panic disorder 10/14/2017  . Memory change 03/18/2016  . Classic migraine with aura 05/23/2015  . Chest pain 04/23/2014  . Migraine 04/23/2014  . Hypothyroidism 04/23/2014  . Bradycardia 04/23/2014  . Intermittent asthma 04/23/2014    Past Surgical History:  Procedure Laterality Date  . APPENDECTOMY    . CERVICAL DISC SURGERY    . COLON SURGERY     Diverticula was removed  . KNEE SURGERY     bilateral  . THYROGLOSSAL DUCT CYST    . TONSILLECTOMY    . TUBAL LIGATION      Prior to Admission medications   Medication Sig Start Date End Date Taking? Authorizing Provider  albuterol (PROVENTIL HFA;VENTOLIN HFA) 108 (90 BASE)  MCG/ACT inhaler Inhale 2 puffs into the lungs every 6 (six) hours as needed for wheezing or shortness of breath.    [provider]  brompheniramine-pseudoephedrine-DM 30-2-10 MG/5ML syrup Take 5 mLs by mouth 4 (four) times daily as needed. 11/05/18   Joni Reining, PA-C  cycloSPORINE (RESTASIS) 0.05 % ophthalmic emulsion Place 1 drop into both eyes 2 (two) times daily.    [provider]  escitalopram (LEXAPRO) 10 MG tablet Take 1 tablet (10 mg total) by mouth daily. 10/14/17   York Spaniel, MD  fluticasone (FLONASE) 50 MCG/ACT nasal spray SHAKE LQ AND U 2 SPRAYS NASALLY QD UTD 10/18/15   [provider]  ibuprofen (ADVIL,MOTRIN) 600 MG tablet Take 1 tablet (600 mg total) by mouth every 8 (eight) hours as needed. 11/05/18   Joni Reining, PA-C  levothyroxine (SYNTHROID, LEVOTHROID) 112 MCG tablet Take 112 mcg by mouth daily before breakfast.    [provider]  methylPREDNISolone (MEDROL DOSEPAK) 4 MG TBPK tablet Take Tapered dose as directed 08/13/18   Joni Reining, PA-C  Multiple Vitamin (MULTIVITAMIN) tablet Take 1 tablet by mouth 2 (two) times daily.    [provider]  nortriptyline (PAMELOR) 10 MG capsule Take 1 capsule (10 mg total) by mouth at bedtime. 10/14/17   York Spaniel, MD  phenol [facility] MOUTH PAIN) 1.4 % LIQD Use as directed  1 spray in the mouth or throat as needed for throat irritation / pain. 08/13/18   Joni Reining, PA-C  rizatriptan (MAXALT-MLT) 10 MG disintegrating tablet  08/23/15   [provider]    Allergies Erythromycin; Percocet [oxycodone-acetaminophen]; Phenergan [promethazine hcl]; and Sulfa antibiotics  Family History  Problem Relation Age of Onset  . GI Disease Mother        Gastroparesis  . Hypertension Mother   . Migraines Mother   . Hypothyroidism Mother   . Migraines Sister   . Diabetes Other   . Cancer Other   . Angina Paternal Grandfather   . Diabetes Paternal Grandfather   .  Stroke Paternal Grandfather   . Cancer Maternal Aunt   . Cancer Maternal Uncle   . Stroke Maternal Grandmother   . Hypertension Maternal Grandfather   . Diabetes Paternal Grandmother   . Stroke Paternal Grandmother   . Hypothyroidism Son     Social History Social History   Tobacco Use  . Smoking status: Former Smoker    Last attempt to quit: 07/17/1989    Years since quitting: 29.3  . Smokeless tobacco: Never Used  Substance Use Topics  . Alcohol use: Yes    Alcohol/week: 0.0 standard drinks    Comment: very rarely  . Drug use: No    Review of Systems  Constitutional: No fever/chills Eyes: No visual changes. ENT: Nasal congestion.   Cardiovascular: Denies chest pain. Respiratory: Denies shortness of breath.  Nonproductive cough. Gastrointestinal: No abdominal pain.  No nausea, no vomiting.  No diarrhea.  No constipation. Genitourinary: Negative for dysuria. Musculoskeletal: Left knee pain. Skin: Negative for rash. Neurological: Negative for headaches, focal weakness or numbness.  History of seizures. Psychiatric:  Endocrine:  Hypothyroidism and panic disorders. Hematological/Lymphatic:  Allergic/Immunilogical: See allergy list. ____________________________________________   PHYSICAL EXAM:  VITAL SIGNS: ED Triage Vitals  Enc Vitals Group     BP 11/05/18 0644 (!) 147/76     Pulse Rate 11/05/18 0644 87     Resp 11/05/18 0644 18     Temp 11/05/18 0644 98.3 F (36.8 C)     Temp Source 11/05/18 0644 Oral     SpO2 11/05/18 0644 97 %     Weight 11/05/18 0645 134 lb 0.6 oz (60.8 kg)     Height 11/05/18 0645 5\' 6"  (1.676 m)     Head Circumference --      Peak Flow --      Pain Score 11/05/18 0645 7     Pain Loc --      Pain Edu? --      Excl. in GC? --     Constitutional: Alert and oriented. Well appearing and in no acute distress. Nose: Edematous nasal turbinates clear rhinorrhea. Mouth/Throat: Mucous membranes are moist.  Oropharynx non-erythematous.  Nasal  drainage. Neck: No stridor.   Cardiovascular: Normal rate, regular rhythm. Grossly normal heart sounds.  Good peripheral circulation. Respiratory: Normal respiratory effort.  No retractions. Lungs CTAB. Musculoskeletal: No obvious deformity left knee.  Abrasion anterior patella.  Patient has mild guarding with palpation.  Mild crepitus with palpation.  Mild joint effusions. Neurologic:  Normal speech and language. No gross focal neurologic deficits are appreciated. No gait instability. Skin:  Skin is warm, dry and intact. No rash noted. Psychiatric: Mood and affect are normal. Speech and behavior are normal.  ____________________________________________   LABS (all labs ordered are listed, but only abnormal results are displayed)  Labs Reviewed  INFLUENZA PANEL BY PCR (  TYPE A & B)   ____________________________________________  EKG   ____________________________________________  RADIOLOGY  ED MD interpretation:    Official radiology report(s): Dg Knee Complete 4 Views Left  Result Date: 11/05/2018 CLINICAL DATA:  Fall. EXAM: LEFT KNEE - COMPLETE 4+ VIEW COMPARISON:  No recent prior. FINDINGS: Mild medial compartment degenerative change. No evidence of fracture dislocation. Small knee joint effusion. IMPRESSION: Mild medial compartment degenerative change. No acute bony abnormality. Small knee joint effusion. Electronically Signed   By: Maisie Fushomas  Register   On: 11/05/2018 07:58    ____________________________________________   PROCEDURES  Procedure(s) performed: None  Procedures  Critical Care performed: No  ____________________________________________   INITIAL IMPRESSION / ASSESSMENT AND PLAN / ED COURSE  As part of my medical decision making, I reviewed the following data within the electronic MEDICAL RECORD NUMBER     Left knee pain secondary to fall.  Patient also has viral respiratory infection.  Discussed x-ray findings with patient.  Patient given discharge care  instruction and advised to follow-up PCP.      ____________________________________________   FINAL CLINICAL IMPRESSION(S) / ED DIAGNOSES  Final diagnoses:  Viral URI with cough  Contusion of left knee, initial encounter     ED Discharge Orders         Ordered    brompheniramine-pseudoephedrine-DM 30-2-10 MG/5ML syrup  4 times daily PRN     11/05/18 0823    ibuprofen (ADVIL,MOTRIN) 600 MG tablet  Every 8 hours PRN     11/05/18 [id number]           Note:  This document was prepared using Dragon voice recognition software and may include unintentional dictation errors.    Joni ReiningSmith, Ronald K, PA-C 11/05/18 [id number]    Jeanmarie PlantMcShane, James A, MD 11/05/18 [phone]

## 2018-11-05 NOTE — ED Triage Notes (Signed)
Patient fell on left knee on concrete on Saturday and states she had surgery on it before and it is swollen and states she can't even straighten it. She has been resting, ice, and compression. She has been taking ibuprofen for pain. Yesterday she started having flu like symptoms. Cough, congestion and chills.

## 2018-11-05 NOTE — ED Notes (Signed)
AAOx3.  Skin warm and dry.  NAD 

## 2019-03-22 ENCOUNTER — Emergency Department: Payer: Medicaid Other

## 2019-03-22 ENCOUNTER — Other Ambulatory Visit: Payer: Self-pay

## 2019-03-22 ENCOUNTER — Emergency Department
Admission: EM | Admit: 2019-03-22 | Discharge: 2019-03-22 | Disposition: A | Discharge status: Home / Self Care | Payer: Medicaid Other | Attending: Emergency Medicine | Admitting: Emergency Medicine

## 2019-03-22 DIAGNOSIS — Z79899 Other long term (current) drug therapy: Secondary | ICD-10-CM | POA: Insufficient documentation

## 2019-03-22 DIAGNOSIS — E039 Hypothyroidism, unspecified: Secondary | ICD-10-CM | POA: Insufficient documentation

## 2019-03-22 DIAGNOSIS — J45909 Unspecified asthma, uncomplicated: Secondary | ICD-10-CM | POA: Insufficient documentation

## 2019-03-22 DIAGNOSIS — R0789 Other chest pain: Secondary | ICD-10-CM

## 2019-03-22 DIAGNOSIS — M79601 Pain in right arm: Secondary | ICD-10-CM | POA: Insufficient documentation

## 2019-03-22 DIAGNOSIS — Z87891 Personal history of nicotine dependence: Secondary | ICD-10-CM | POA: Insufficient documentation

## 2019-03-22 LAB — BASIC METABOLIC PANEL
Anion gap: 9 (ref 5–15)
BUN: 11 mg/dL (ref 6–20)
CO2: 29 mmol/L (ref 22–32)
Calcium: 9.5 mg/dL (ref 8.9–10.3)
Chloride: 104 mmol/L (ref 98–111)
Creatinine, Ser: 0.71 mg/dL (ref 0.44–1.00)
GFR calc Af Amer: >60 mL/min (ref >60)
GFR calc non Af Amer: >60 mL/min (ref >60)
Glucose, Bld: 90 mg/dL (ref 70–99)
Potassium: 3.7 mmol/L (ref 3.5–5.1)
Sodium: 142 mmol/L (ref 135–145)

## 2019-03-22 LAB — CBC
HCT: 41.1 % (ref 36.0–46.0)
Hemoglobin: 13.2 g/dL (ref 12.0–15.0)
MCH: 28.6 pg (ref 26.0–34.0)
MCHC: 32.1 g/dL (ref 30.0–36.0)
MCV: 89 fL (ref 80.0–100.0)
Platelets: 298 10*3/uL (ref 150–400)
RBC: 4.62 MIL/uL (ref 3.87–5.11)
RDW: 13.4 % (ref 11.5–15.5)
WBC: 7.9 10*3/uL (ref 4.0–10.5)
nRBC: 0 % (ref 0.0–0.2)

## 2019-03-22 LAB — TROPONIN I (HIGH SENSITIVITY)
Troponin I (High Sensitivity): 2 ng/L (ref <18)
Troponin I (High Sensitivity): <2 ng/L (ref <18)

## 2019-03-22 MED ORDER — ASPIRIN 81 MG PO CHEW
324.0000 mg | CHEWABLE_TABLET | Freq: Once | ORAL | Status: AC
  Administered 2019-03-22: 23:00:00 324 mg via ORAL
  Filled 2019-03-22: qty 4

## 2019-03-22 NOTE — ED Provider Notes (Addendum)
[facility] Emergency Department Provider Note   ____________________________________________   First MD Initiated Contact with Patient 03/22/19 2208     (approximate)  I have reviewed the triage vital signs and the nursing notes.   HISTORY  Chief Complaint Arm Pain, Neck Pain, and Chest Pain    HPI A 48 year old patient presents for evaluation of chest pain. Initial onset of pain was approximately 3-6 hours ago. The patient's chest pain is well-localized, is sharp and is not worse with exertion. The patient reports some diaphoresis. The patient's chest pain is not middle- or left-sided, is not described as heaviness/pressure/tightness and does radiate to the arms/jaw/neck. The patient does not complain of nausea. The patient has a family history of coronary artery disease in a first-degree relative with onset less than age 49. The patient has no history of stroke, has no history of peripheral artery disease, has not smoked in the past 90 days, denies any history of treated diabetes, is not hypertensive, has no history of hypercholesterolemia and does not have an elevated BMI (>=30).   Patient does have a history of asthma  Patient is having 3 months of pain that runs from the right elbow down into the 6/4 and fifth digits of the right hand.  Lambert ModySharp comes and goes.  She reports she had that pain today and then started to feel sweaty and hot reports she cannot quite tell if she is feeling that way because she is going through menopause or if it was pain from the arm or pain from her chest.  It radiated up to her right upper chest towards her right upper neck.  No left-sided pain.  It was very sharp.  No pressure.  Denies history of coronary disease.  No shortness of breath  Pain currently mild but persistent in the right forearm which has a tingly feeling in the right fourth and fifth fingers which is been present for about 3 months.  The sweating and severe pain has  subsided.  Started about a little afternoon today while she was at work  Past Medical History:  Diagnosis Date  . Asthma   . Classic migraine with aura 05/23/2015  . Diverticulitis   . GERD (gastroesophageal reflux disease)   . Hypothyroidism   . Intermittent asthma   . Kidney infection   . Kidney stones   . Migraine   . Osteoporosis   . Panic disorder 10/14/2017  . Scoliosis   . Seasonal allergies   . Seizures (HCC)   . Thyroid disease     Patient Active Problem List   Diagnosis Date Noted  . Panic disorder 10/14/2017  . Memory change 03/18/2016  . Classic migraine with aura 05/23/2015  . Chest pain 04/23/2014  . Migraine 04/23/2014  . Hypothyroidism 04/23/2014  . Bradycardia 04/23/2014  . Intermittent asthma 04/23/2014    Past Surgical History:  Procedure Laterality Date  . APPENDECTOMY    . CERVICAL DISC SURGERY    . COLON SURGERY     Diverticula was removed  . KNEE SURGERY     bilateral  . THYROGLOSSAL DUCT CYST    . TONSILLECTOMY    . TUBAL LIGATION      Prior to Admission medications   Medication Sig Start Date End Date Taking? Authorizing Provider  albuterol (PROVENTIL HFA;VENTOLIN HFA) 108 (90 BASE) MCG/ACT inhaler Inhale 2 puffs into the lungs every 6 (six) hours as needed for wheezing or shortness of breath.    [provider]  brompheniramine-pseudoephedrine-DM 30-2-10 MG/5ML syrup Take 5 mLs by mouth 4 (four) times daily as needed. 11/05/18   Sable Feil, PA-C  cycloSPORINE (RESTASIS) 0.05 % ophthalmic emulsion Place 1 drop into both eyes 2 (two) times daily.    [provider]  escitalopram (LEXAPRO) 10 MG tablet Take 1 tablet (10 mg total) by mouth daily. 10/14/17   Kathrynn Ducking, MD  fluticasone (FLONASE) 50 MCG/ACT nasal spray SHAKE LQ AND U 2 SPRAYS NASALLY QD UTD 10/18/15   [provider]  ibuprofen (ADVIL,MOTRIN) 600 MG tablet Take 1 tablet (600 mg total) by mouth every 8 (eight) hours as needed. 11/05/18   Sable Feil, PA-C  levothyroxine (SYNTHROID, LEVOTHROID) 112 MCG tablet Take 112 mcg by mouth daily before breakfast.    [provider]  methylPREDNISolone (MEDROL DOSEPAK) 4 MG TBPK tablet Take Tapered dose as directed 08/13/18   Sable Feil, PA-C  Multiple Vitamin (MULTIVITAMIN) tablet Take 1 tablet by mouth 2 (two) times daily.    [provider]  nortriptyline (PAMELOR) 10 MG capsule Take 1 capsule (10 mg total) by mouth at bedtime. 10/14/17   Kathrynn Ducking, MD  phenol [facility] MOUTH PAIN) 1.4 % LIQD Use as directed 1 spray in the mouth or throat as needed for throat irritation / pain. 08/13/18   Sable Feil, PA-C  rizatriptan (MAXALT-MLT) 10 MG disintegrating tablet  08/23/15   [provider]    Allergies Erythromycin, Percocet [oxycodone-acetaminophen], Phenergan [promethazine hcl], and Sulfa antibiotics  Family History  Problem Relation Age of Onset  . GI Disease Mother        Gastroparesis  . Hypertension Mother   . Migraines Mother   . Hypothyroidism Mother   . Migraines Sister   . Diabetes Other   . Cancer Other   . Angina Paternal Grandfather   . Diabetes Paternal Grandfather   . Stroke Paternal Grandfather   . Cancer Maternal Aunt   . Cancer Maternal Uncle   . Stroke Maternal Grandmother   . Hypertension Maternal Grandfather   . Diabetes Paternal Grandmother   . Stroke Paternal Grandmother   . Hypothyroidism Son     Social History Social History   Tobacco Use  . Smoking status: Former Smoker    Quit date: 07/17/1989    Years since quitting: 29.6  . Smokeless tobacco: Never Used  Substance Use Topics  . Alcohol use: Yes    Alcohol/week: 0.0 standard drinks    Comment: very rarely  . Drug use: No    Review of Systems Constitutional: No fever/chills but felt real sweaty for short period of time when the pain started Eyes: No visual changes. ENT: No sore throat. Cardiovascular: Denies chest pain except as noted,  radiated from the right arm up to the right shoulder right upper chest. Respiratory: Denies shortness of breath. Gastrointestinal: No abdominal pain.   Genitourinary: Negative for dysuria. Musculoskeletal: Negative for back pain. Skin: Negative for rash. Neurological: Negative for headaches, areas of focal weakness or numbness.  Has never been on an airplane.  No recent long trips or travel.  No leg swelling.  No history of blood clots.  Does not take any estrogen.  Previous tubal ligation, denies pregnancy.  No recent surgeries or trauma.  No cough no history of blood clots.  ____________________________________________   PHYSICAL EXAM:  VITAL SIGNS: ED Triage Vitals  Enc Vitals Group     BP 03/22/19 1634 (!) 168/93     Pulse Rate  03/22/19 1634 77     Resp 03/22/19 1634 18     Temp 03/22/19 1634 98.2 F (36.8 C)     Temp Source 03/22/19 1634 Oral     SpO2 03/22/19 1634 100 %     Weight 03/22/19 1635 143 lb (64.9 kg)     Height 03/22/19 1635 5\' 6"  (1.676 m)     Head Circumference --      Peak Flow --      Pain Score 03/22/19 1634 8     Pain Loc --      Pain Edu? --      Excl. in GC? --     Constitutional: Alert and oriented. Well appearing and in no acute distress. Eyes: Conjunctivae are normal. Head: Atraumatic. Nose: No congestion/rhinnorhea. Mouth/Throat: Mucous membranes are moist. Neck: No stridor.  Cardiovascular: Normal rate, regular rhythm. Grossly normal heart sounds.  Good peripheral circulation. Respiratory: Normal respiratory effort.  No retractions. Lungs CTAB. Gastrointestinal: Soft and nontender. No distention. Musculoskeletal: No lower extremity tenderness nor edema. Neurologic:  Normal speech and language. No gross focal neurologic deficits are appreciated.  Skin:  Skin is warm, dry and intact. No rash noted. Psychiatric: Mood and affect are normal. Speech and behavior are normal.  ____________________________________________   LABS (all labs  ordered are listed, but only abnormal results are displayed)  Labs Reviewed  BASIC METABOLIC PANEL  CBC  TROPONIN I (HIGH SENSITIVITY)  TROPONIN I (HIGH SENSITIVITY)  POC URINE PREG, ED   ____________________________________________  EKG  Reviewed entered by me at 1640 Heart rate 75 QRS 80 QTc 400 Normal sinus rhythm, no evidence of acute ischemia ____________________________________________  RADIOLOGY  Dg Chest 2 View  Result Date: 03/22/2019 CLINICAL DATA:  Right sided chest pain that radiates down right arm. Pt reports feeling diaphoretic when pain first began at1230. Skin warm and dry at this time.pt has chronic arm pain to right side and has a nerve conduction tests scheduled for later this week. History of thyroid disease, seizures, GERD, and asthma. EXAM: CHEST - 2 VIEW COMPARISON:  04/23/2014 FINDINGS: Cardiomediastinal silhouette is normal. Lungs are free of focal consolidations and pleural effusions. No pulmonary edema. IMPRESSION: No active cardiopulmonary disease. Electronically Signed   By: Norva PavlovElizabeth  Brown M.D.   On: 03/22/2019 17:08    Chest x-ray reviewed negative for acute ____________________________________________   PROCEDURES  Procedure(s) performed: None  Procedures  Critical Care performed: No  ____________________________________________   INITIAL IMPRESSION / ASSESSMENT AND PLAN / ED COURSE  Pertinent labs & imaging results that were available during my care of the patient were reviewed by me and considered in my medical decision making (see chart for details).   Differential diagnosis includes, but is not limited to, ACS, aortic dissection, pulmonary embolism, cardiac tamponade, pneumothorax, pneumonia, pericarditis, myocarditis, GI-related causes including esophagitis/gastritis, and musculoskeletal chest wall pain.  Patient complains primarily of right arm pain.  Patient be chronic in nature with a worsening that was associated with sweats  today.  Radiate up to her right chest.  To improve now, she has close follow-up already planned in pain matches in ulnar distribution she reports they are evaluating her for golfers elbow.  I suspect her pain is likely from this source possibly radicular, and neuropathic, musculoskeletal, she has no risk factor for PE.  Chest x-ray normal no signs or symptoms of dissection or pneumothorax.  No infectious etiology of her symptoms.  Highly unlikely represent ACS, sharp, right side primarily in the right arm.  HEAR Score: 2       Pulmonary Embolism Rule-out Criteria (PERC rule)                        If YES to ANY of the following, the PERC rule is not satisfied and cannot be used to rule out PE in this patient (consider d-dimer or imaging depending on pre-test probability).                      If NO to ALL of the following, AND the clinician's pre-test probability is <15%, the [facility] rule is satisfied and there is no need for further workup (including no need to obtain a d-dimer) as the post-test probability of pulmonary embolism is <2%.                      Mnemonic is HAD CLOTS   H - hormone use (exogenous estrogen)      No. A - age > 50                                                 No. D - DVT/PE history                                      No.   C - coughing blood (hemoptysis)                 No. L - leg swelling, unilateral                             No. O - O2 Sat on Room Air < 95%                  No. T - tachycardia (HR ? 100)                         No. S - surgery or trauma, recent                      No.   Based on my evaluation of the patient, including application of this decision instrument, further testing to evaluate for pulmonary embolism is indicated at this time.   Discussed with patient and her reassuring results.  She is comfortable with this plan.  She will follow-up with cardiology, but also has plans for Emg as well as orthopedic follow-up this week for ongoing  pain and neuropathic symptoms in the right hand.  Return precautions and treatment recommendations and follow-up discussed with the patient who is agreeable with the plan.        ____________________________________________   FINAL CLINICAL IMPRESSION(S) / ED DIAGNOSES  Final diagnoses:  Right arm pain  Atypical chest pain        Note:  This document was prepared using Dragon voice recognition software and may include unintentional dictation errors       Sharyn CreamerQuale, Butler Vegh, MD 03/22/19 2337    Sharyn CreamerQuale, Masayo Fera, MD 03/22/19 2337

## 2019-03-22 NOTE — ED Triage Notes (Signed)
Right sided chest pain that radiates down right arm. Pt reports feeling diaphoretic when pain first began at1230. Skin warm and dry at this time.pt has chronic arm pain to right side and has a nerve conduction test scheduled for later this week. Pt alert and oriented X4, active, cooperative, pt in NAD. RR even and unlabored, color WNL.

## 2019-03-22 NOTE — Discharge Instructions (Addendum)
You have been seen in the Emergency Department (ED) today for chest pain.  As we have discussed todays test results are normal, but you may require further testing.  Also, please follow-up with your orthopedic doctor as I am suspicious this pain is related to the pain he be experiencing her right arm as well.  Please follow up with the recommended doctor as instructed above in these documents regarding todays emergent visit and your recent symptoms to discuss further management.  Continue to take your regular medications. If you are not doing so already, please also take a daily baby aspirin (81 mg), at least until you follow up with your doctor.  Return to the Emergency Department (ED) if you experience any further chest pain/pressure/tightness, difficulty breathing, or sudden sweating, or other symptoms that concern you.

## 2020-07-05 IMAGING — DX DG TIBIA/FIBULA 2V*R*
3 series · 3 of 3 positions shown · non-contrast
Comparison: None.

CLINICAL DATA: Abrasion to the anterior right leg post fall.

EXAM:
RIGHT TIBIA AND FIBULA - 2 VIEW

[tibia ap]
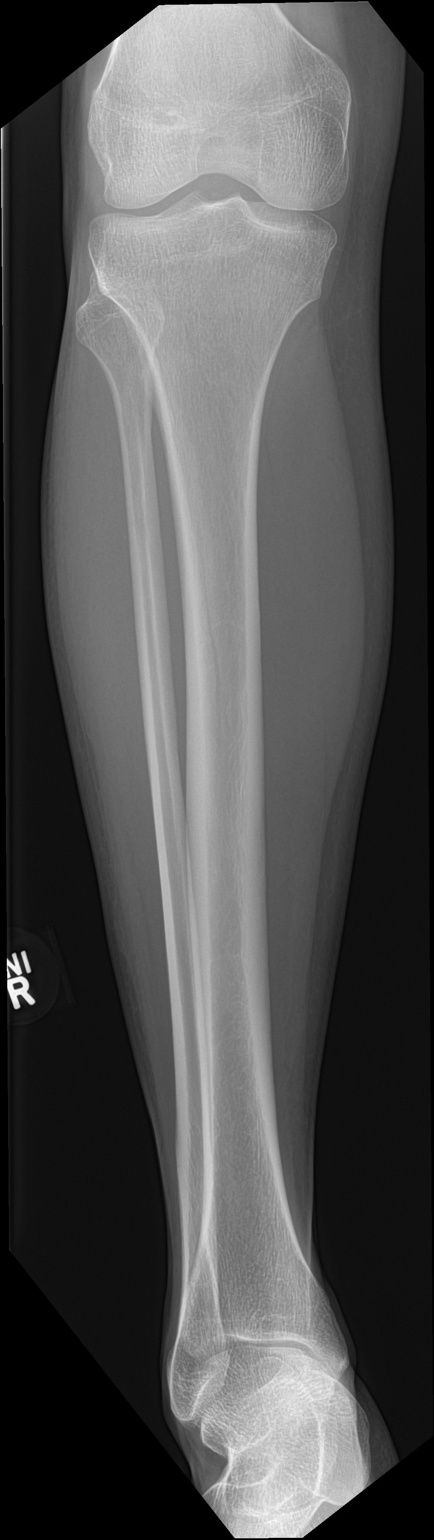

[tibia lat (1 of 2)]
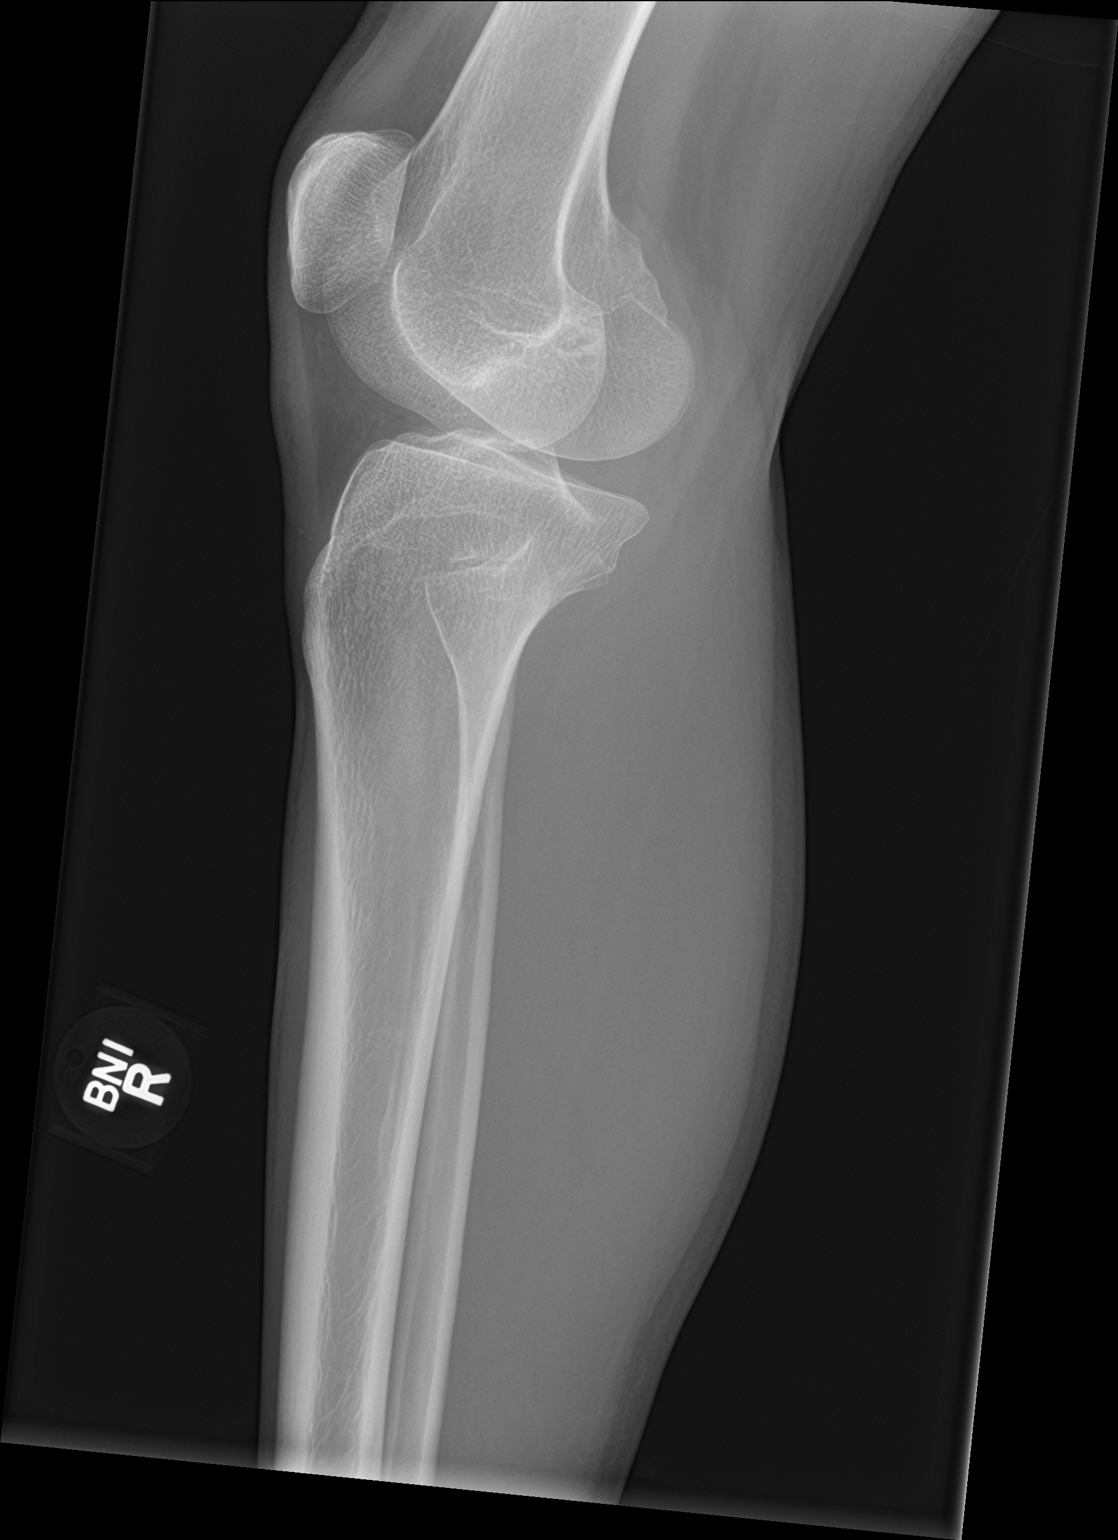

[tibia lat (2 of 2)]
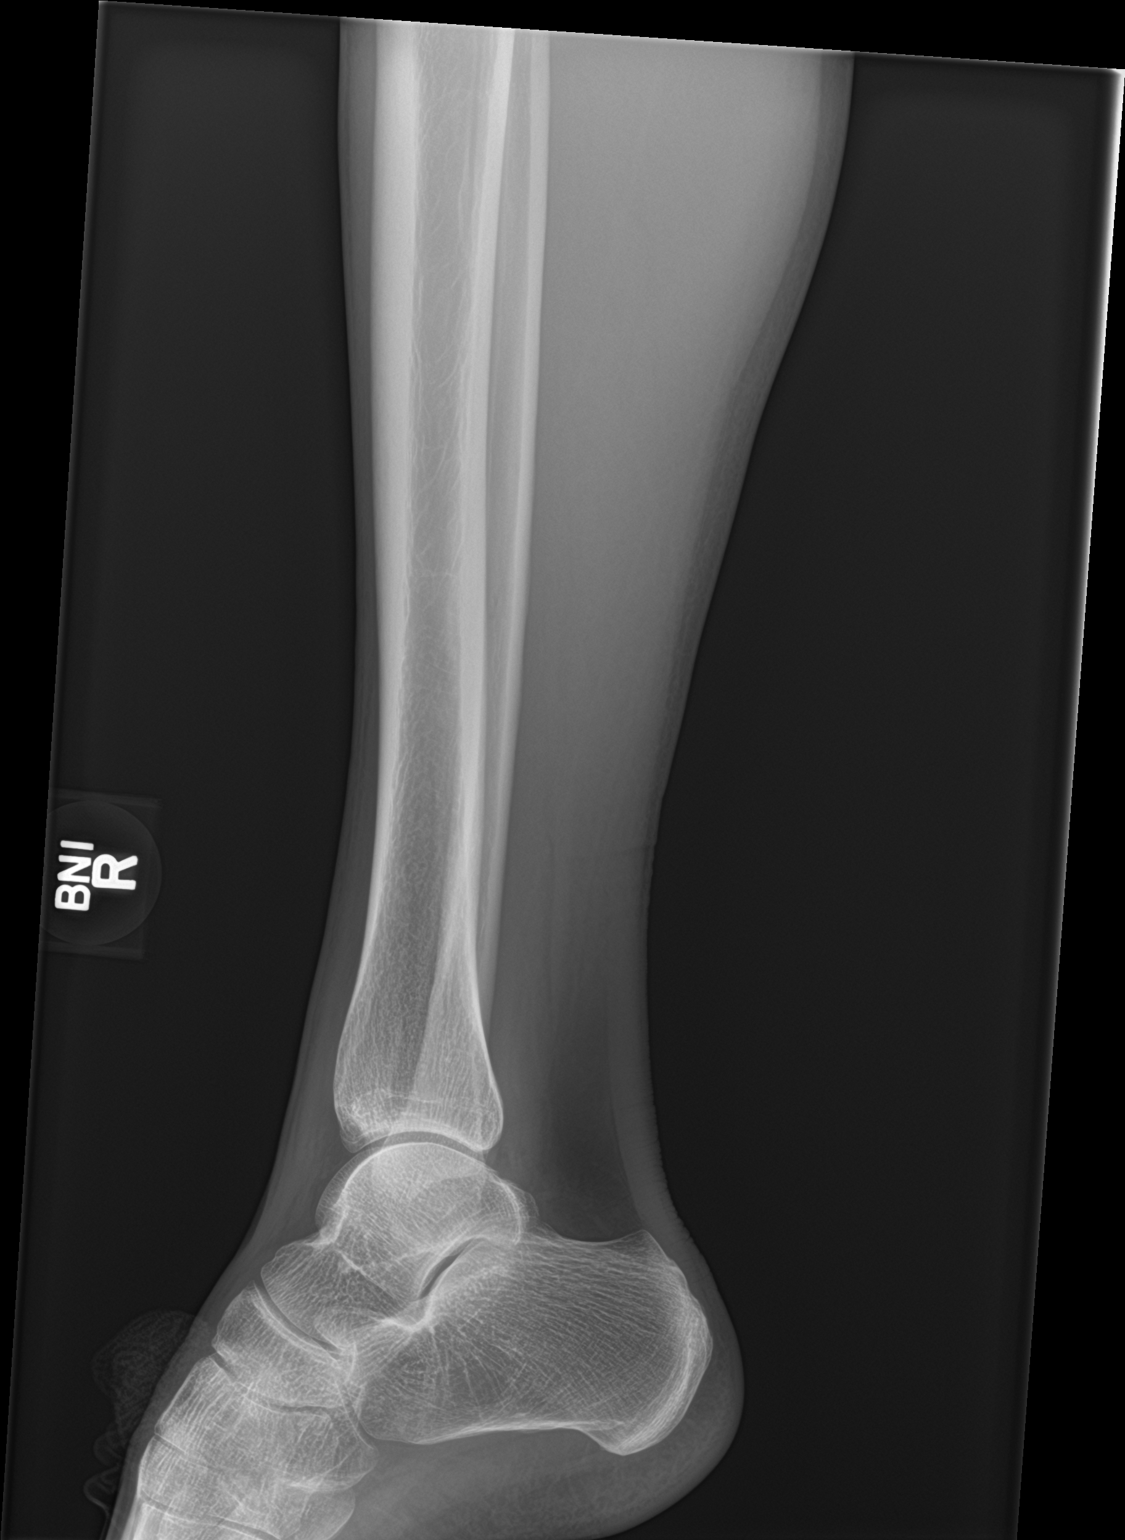

[3 of 3 positions shown; findings below may reference images not displayed]

FINDINGS: There is no evidence of fracture or other focal bone lesions.
Minimal soft tissue thickening anteriorly.
IMPRESSION: No acute fracture or dislocation identified about the right tibia or
fibula.

## 2021-12-10 ENCOUNTER — Encounter (HOSPITAL_COMMUNITY): Payer: Self-pay

## 2021-12-10 ENCOUNTER — Ambulatory Visit (INDEPENDENT_AMBULATORY_CARE_PROVIDER_SITE_OTHER): Payer: BLUE CROSS/BLUE SHIELD

## 2021-12-10 ENCOUNTER — Other Ambulatory Visit: Payer: Self-pay

## 2021-12-10 ENCOUNTER — Ambulatory Visit (HOSPITAL_COMMUNITY)
Admission: EM | Admit: 2021-12-10 | Discharge: 2021-12-10 | Disposition: A | Discharge status: Home / Self Care | Payer: BLUE CROSS/BLUE SHIELD | Attending: Internal Medicine | Admitting: Internal Medicine

## 2021-12-10 DIAGNOSIS — S93401A Sprain of unspecified ligament of right ankle, initial encounter: Secondary | ICD-10-CM | POA: Diagnosis not present

## 2021-12-10 DIAGNOSIS — M25571 Pain in right ankle and joints of right foot: Secondary | ICD-10-CM | POA: Diagnosis not present

## 2021-12-10 MED ORDER — IBUPROFEN 600 MG PO TABS: 600.0000 mg | ORAL_TABLET | Freq: Three times a day (TID) | ORAL | 0 refills | Status: AC | PRN

## 2021-12-10 NOTE — Discharge Instructions (Addendum)
Gentle range of motion exercises ?Please take ibuprofen as prescribed ?Your x-rays negative for fracture ?Please follow-up with primary care physician if symptoms persist. ?

## 2021-12-10 NOTE — ED Triage Notes (Signed)
Pt presents with rt ankle pain x 2 weeks. NKI ?
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
## 2022-06-21 ENCOUNTER — Inpatient Hospital Stay
Admission: EM | Admit: 2022-06-21 | Discharge: 2022-06-25 | DRG: 604 | Disposition: A | Discharge status: Home / Self Care | Payer: BLUE CROSS/BLUE SHIELD | Attending: Internal Medicine | Admitting: Internal Medicine

## 2022-06-21 ENCOUNTER — Emergency Department: Payer: BLUE CROSS/BLUE SHIELD

## 2022-06-21 ENCOUNTER — Other Ambulatory Visit: Payer: Self-pay

## 2022-06-21 DIAGNOSIS — J4520 Mild intermittent asthma, uncomplicated: Secondary | ICD-10-CM | POA: Diagnosis present

## 2022-06-21 DIAGNOSIS — M810 Age-related osteoporosis without current pathological fracture: Secondary | ICD-10-CM | POA: Diagnosis present

## 2022-06-21 DIAGNOSIS — G40909 Epilepsy, unspecified, not intractable, without status epilepticus: Secondary | ICD-10-CM | POA: Diagnosis not present

## 2022-06-21 DIAGNOSIS — Y92410 Unspecified street and highway as the place of occurrence of the external cause: Secondary | ICD-10-CM

## 2022-06-21 DIAGNOSIS — I447 Left bundle-branch block, unspecified: Secondary | ICD-10-CM | POA: Diagnosis present

## 2022-06-21 DIAGNOSIS — I5041 Acute combined systolic (congestive) and diastolic (congestive) heart failure: Secondary | ICD-10-CM

## 2022-06-21 DIAGNOSIS — K219 Gastro-esophageal reflux disease without esophagitis: Secondary | ICD-10-CM | POA: Diagnosis present

## 2022-06-21 DIAGNOSIS — Z23 Encounter for immunization: Secondary | ICD-10-CM

## 2022-06-21 DIAGNOSIS — Z8249 Family history of ischemic heart disease and other diseases of the circulatory system: Secondary | ICD-10-CM

## 2022-06-21 DIAGNOSIS — R079 Chest pain, unspecified: Secondary | ICD-10-CM | POA: Diagnosis present

## 2022-06-21 DIAGNOSIS — Z87891 Personal history of nicotine dependence: Secondary | ICD-10-CM

## 2022-06-21 DIAGNOSIS — Z882 Allergy status to sulfonamides status: Secondary | ICD-10-CM

## 2022-06-21 DIAGNOSIS — R7989 Other specified abnormal findings of blood chemistry: Secondary | ICD-10-CM | POA: Diagnosis present

## 2022-06-21 DIAGNOSIS — S20212A Contusion of left front wall of thorax, initial encounter: Secondary | ICD-10-CM | POA: Diagnosis not present

## 2022-06-21 DIAGNOSIS — I110 Hypertensive heart disease with heart failure: Secondary | ICD-10-CM | POA: Diagnosis present

## 2022-06-21 DIAGNOSIS — Z888 Allergy status to other drugs, medicaments and biological substances status: Secondary | ICD-10-CM

## 2022-06-21 DIAGNOSIS — E039 Hypothyroidism, unspecified: Secondary | ICD-10-CM | POA: Diagnosis present

## 2022-06-21 DIAGNOSIS — R001 Bradycardia, unspecified: Secondary | ICD-10-CM | POA: Diagnosis present

## 2022-06-21 DIAGNOSIS — J302 Other seasonal allergic rhinitis: Secondary | ICD-10-CM | POA: Diagnosis present

## 2022-06-21 DIAGNOSIS — Z885 Allergy status to narcotic agent status: Secondary | ICD-10-CM

## 2022-06-21 DIAGNOSIS — R55 Syncope and collapse: Secondary | ICD-10-CM | POA: Diagnosis not present

## 2022-06-21 DIAGNOSIS — G43909 Migraine, unspecified, not intractable, without status migrainosus: Secondary | ICD-10-CM | POA: Diagnosis present

## 2022-06-21 DIAGNOSIS — F410 Panic disorder [episodic paroxysmal anxiety] without agoraphobia: Secondary | ICD-10-CM | POA: Diagnosis present

## 2022-06-21 DIAGNOSIS — Z881 Allergy status to other antibiotic agents status: Secondary | ICD-10-CM

## 2022-06-21 LAB — COMPREHENSIVE METABOLIC PANEL
ALT: 32 U/L (ref 0–44)
AST: 34 U/L (ref 15–41)
Albumin: 4.5 g/dL (ref 3.5–5.0)
Alkaline Phosphatase: 82 U/L (ref 38–126)
Anion gap: 6 (ref 5–15)
BUN: 13 mg/dL (ref 6–20)
CO2: 29 mmol/L (ref 22–32)
Calcium: 9.6 mg/dL (ref 8.9–10.3)
Chloride: 107 mmol/L (ref 98–111)
Creatinine, Ser: 0.71 mg/dL (ref 0.44–1.00)
GFR, Estimated: >60 mL/min (ref >60)
Glucose, Bld: 101 mg/dL — ABNORMAL HIGH (ref 70–99)
Potassium: 4 mmol/L (ref 3.5–5.1)
Sodium: 142 mmol/L (ref 135–145)
Total Bilirubin: 0.5 mg/dL (ref 0.3–1.2)
Total Protein: 7.8 g/dL (ref 6.5–8.1)

## 2022-06-21 LAB — TROPONIN I (HIGH SENSITIVITY)
Troponin I (High Sensitivity): 23 ng/L — ABNORMAL HIGH (ref <18)
Troponin I (High Sensitivity): 35 ng/L — ABNORMAL HIGH (ref <18)

## 2022-06-21 LAB — CBC WITH DIFFERENTIAL/PLATELET
Abs Immature Granulocytes: 0.01 10*3/uL (ref 0.00–0.07)
Basophils Absolute: 0 10*3/uL (ref 0.0–0.1)
Basophils Relative: 0 %
Eosinophils Absolute: 0.1 10*3/uL (ref 0.0–0.5)
Eosinophils Relative: 2 %
HCT: 40.3 % (ref 36.0–46.0)
Hemoglobin: 12.9 g/dL (ref 12.0–15.0)
Immature Granulocytes: 0 %
Lymphocytes Relative: 25 %
Lymphs Abs: 1.5 10*3/uL (ref 0.7–4.0)
MCH: 27.7 pg (ref 26.0–34.0)
MCHC: 32 g/dL (ref 30.0–36.0)
MCV: 86.7 fL (ref 80.0–100.0)
Monocytes Absolute: 0.6 10*3/uL (ref 0.1–1.0)
Monocytes Relative: 9 %
Neutro Abs: 3.8 10*3/uL (ref 1.7–7.7)
Neutrophils Relative %: 64 %
Platelets: 244 10*3/uL (ref 150–400)
RBC: 4.65 MIL/uL (ref 3.87–5.11)
RDW: 13.3 % (ref 11.5–15.5)
WBC: 6 10*3/uL (ref 4.0–10.5)
nRBC: 0 % (ref 0.0–0.2)

## 2022-06-21 MED ORDER — IOHEXOL 300 MG/ML  SOLN
100.0000 mL | Freq: Once | INTRAMUSCULAR | Status: AC | PRN
  Administered 2022-06-21: 100 mL via INTRAVENOUS

## 2022-06-21 MED ORDER — MORPHINE SULFATE (PF) 4 MG/ML IV SOLN
4.0000 mg | Freq: Once | INTRAVENOUS | Status: AC
  Administered 2022-06-21: 4 mg via INTRAVENOUS
  Filled 2022-06-21: qty 1

## 2022-06-21 NOTE — ED Notes (Signed)
Pt was a hard stick for IV. Another RN is attempting the USIV at this time.
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
## 2022-06-21 NOTE — ED Notes (Signed)
Pt endorsing chest pain. Provider notified.
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
## 2022-06-21 NOTE — ED Notes (Signed)
ED Provider at bedside. 

## 2022-06-21 NOTE — ED Notes (Signed)
Patient transported back from CT 

## 2022-06-22 ENCOUNTER — Observation Stay (HOSPITAL_BASED_OUTPATIENT_CLINIC_OR_DEPARTMENT_OTHER)
Admit: 2022-06-22 | Discharge: 2022-06-22 | Disposition: A | Discharge status: Home / Self Care | Payer: BLUE CROSS/BLUE SHIELD | Attending: Internal Medicine | Admitting: Internal Medicine

## 2022-06-22 DIAGNOSIS — R079 Chest pain, unspecified: Secondary | ICD-10-CM

## 2022-06-22 LAB — ECHOCARDIOGRAM COMPLETE
AR max vel: 1.66 cm2
AV Peak grad: 8.8 mmHg
Ao pk vel: 1.48 m/s
Area-P 1/2: 3.16 cm2
Calc EF: 42.3 %
Height: 66 in
S' Lateral: 4.4 cm
Single Plane A2C EF: 39 %
Single Plane A4C EF: 45.7 %
Weight: 2384 oz

## 2022-06-22 LAB — TROPONIN I (HIGH SENSITIVITY): Troponin I (High Sensitivity): 14 ng/L (ref <18)

## 2022-06-22 LAB — CBC
HCT: 36.9 % (ref 36.0–46.0)
Hemoglobin: 11.7 g/dL — ABNORMAL LOW (ref 12.0–15.0)
MCH: 28.3 pg (ref 26.0–34.0)
MCHC: 31.7 g/dL (ref 30.0–36.0)
MCV: 89.3 fL (ref 80.0–100.0)
Platelets: 229 10*3/uL (ref 150–400)
RBC: 4.13 MIL/uL (ref 3.87–5.11)
RDW: 13.4 % (ref 11.5–15.5)
WBC: 5.1 10*3/uL (ref 4.0–10.5)
nRBC: 0 % (ref 0.0–0.2)

## 2022-06-22 LAB — CREATININE, SERUM
Creatinine, Ser: 0.83 mg/dL (ref 0.44–1.00)
GFR, Estimated: >60 mL/min (ref >60)

## 2022-06-22 LAB — HIV ANTIBODY (ROUTINE TESTING W REFLEX): HIV Screen 4th Generation wRfx: NONREACTIVE

## 2022-06-22 MED ORDER — PERFLUTREN LIPID MICROSPHERE
1.0000 mL | INTRAVENOUS | Status: AC | PRN
  Administered 2022-06-22: 4 mL via INTRAVENOUS

## 2022-06-22 MED ORDER — ELETRIPTAN HYDROBROMIDE 20 MG PO TABS
20.0000 mg | ORAL_TABLET | Freq: Once | ORAL | Status: AC
  Administered 2022-06-22: 20 mg via ORAL
  Filled 2022-06-22: qty 1

## 2022-06-22 MED ORDER — LEVOTHYROXINE SODIUM 100 MCG PO TABS
100.0000 ug | ORAL_TABLET | Freq: Every day | ORAL | Status: DC
  Administered 2022-06-22 – 2022-06-25 (×4): 100 ug via ORAL
  Filled 2022-06-22 (×5): qty 1

## 2022-06-22 MED ORDER — ACETAMINOPHEN 325 MG PO TABS
650.0000 mg | ORAL_TABLET | ORAL | Status: DC | PRN
  Administered 2022-06-22 – 2022-06-23 (×2): 650 mg via ORAL
  Filled 2022-06-22 (×3): qty 2

## 2022-06-22 MED ORDER — SODIUM CHLORIDE 0.9 % IV SOLN: INTRAVENOUS | Status: DC

## 2022-06-22 MED ORDER — ALBUTEROL SULFATE (2.5 MG/3ML) 0.083% IN NEBU: 2.5000 mg | INHALATION_SOLUTION | Freq: Four times a day (QID) | RESPIRATORY_TRACT | Status: DC | PRN

## 2022-06-22 MED ORDER — ENOXAPARIN SODIUM 40 MG/0.4ML IJ SOSY
40.0000 mg | PREFILLED_SYRINGE | INTRAMUSCULAR | Status: DC
  Administered 2022-06-22 – 2022-06-25 (×4): 40 mg via SUBCUTANEOUS
  Filled 2022-06-22 (×4): qty 0.4

## 2022-06-22 MED ORDER — INFLUENZA VAC SPLIT QUAD 0.5 ML IM SUSY: 0.5000 mL | PREFILLED_SYRINGE | INTRAMUSCULAR | Status: DC

## 2022-06-22 MED ORDER — ALBUTEROL SULFATE HFA 108 (90 BASE) MCG/ACT IN AERS: 2.0000 | INHALATION_SPRAY | Freq: Four times a day (QID) | RESPIRATORY_TRACT | Status: DC | PRN

## 2022-06-22 MED ORDER — MORPHINE SULFATE (PF) 2 MG/ML IV SOLN
2.0000 mg | INTRAVENOUS | Status: DC | PRN
  Administered 2022-06-22 – 2022-06-23 (×3): 2 mg via INTRAVENOUS
  Filled 2022-06-22 (×3): qty 1

## 2022-06-22 MED ORDER — ONDANSETRON HCL 4 MG/2ML IJ SOLN
4.0000 mg | Freq: Four times a day (QID) | INTRAMUSCULAR | Status: DC | PRN
Start: 2022-06-22 — End: 2022-06-25

## 2022-06-22 NOTE — ED Notes (Signed)
Report to ariel, rn.  

## 2022-06-22 NOTE — ED Notes (Signed)
Lights dimmed for comfort, po fluids, purse and cellphone at side, call bell at left side.

## 2022-06-22 NOTE — ED Notes (Signed)
Pt sleeping. 

## 2022-06-22 NOTE — ED Notes (Signed)
Report from antacia, rn.
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
## 2022-06-22 NOTE — Progress Notes (Signed)
*  PRELIMINARY RESULTS* Echocardiogram 2D Echocardiogram has been performed. Definity ultrasound imaging agent used on this study.  Claretta Fraise 06/22/2022, 11:54 AM

## 2022-06-23 ENCOUNTER — Other Ambulatory Visit: Payer: Self-pay

## 2022-06-23 ENCOUNTER — Observation Stay: Payer: BLUE CROSS/BLUE SHIELD

## 2022-06-23 DIAGNOSIS — R079 Chest pain, unspecified: Secondary | ICD-10-CM | POA: Diagnosis not present

## 2022-06-23 DIAGNOSIS — I5041 Acute combined systolic (congestive) and diastolic (congestive) heart failure: Secondary | ICD-10-CM

## 2022-06-23 DIAGNOSIS — R569 Unspecified convulsions: Secondary | ICD-10-CM | POA: Diagnosis not present

## 2022-06-23 LAB — TROPONIN I (HIGH SENSITIVITY): Troponin I (High Sensitivity): 7 ng/L (ref <18)

## 2022-06-23 LAB — GLUCOSE, CAPILLARY: Glucose-Capillary: 121 mg/dL — ABNORMAL HIGH (ref 70–99)

## 2022-06-23 MED ORDER — FUROSEMIDE 20 MG PO TABS: 40.0000 mg | ORAL_TABLET | Freq: Every day | ORAL | 0 refills | Status: AC

## 2022-06-23 MED ORDER — FUROSEMIDE 10 MG/ML IJ SOLN
20.0000 mg | Freq: Two times a day (BID) | INTRAMUSCULAR | Status: DC
  Administered 2022-06-23 – 2022-06-25 (×4): 20 mg via INTRAVENOUS
  Filled 2022-06-23 (×4): qty 2

## 2022-06-23 MED ORDER — LORAZEPAM 2 MG/ML IJ SOLN
INTRAMUSCULAR | Status: AC
  Administered 2022-06-23: 2 mg
  Filled 2022-06-23: qty 1

## 2022-06-23 MED ORDER — SACUBITRIL-VALSARTAN 24-26 MG PO TABS: 1.0000 | ORAL_TABLET | Freq: Two times a day (BID) | ORAL | 0 refills | Status: AC

## 2022-06-23 MED ORDER — SACUBITRIL-VALSARTAN 24-26 MG PO TABS
1.0000 | ORAL_TABLET | Freq: Two times a day (BID) | ORAL | Status: DC
  Administered 2022-06-23 – 2022-06-25 (×4): 1 via ORAL
  Filled 2022-06-23 (×4): qty 1

## 2022-06-23 MED ORDER — GADOBUTROL 1 MMOL/ML IV SOLN
7.5000 mL | Freq: Once | INTRAVENOUS | Status: AC | PRN
  Administered 2022-06-23: 7.5 mL via INTRAVENOUS

## 2022-06-23 NOTE — Significant Event (Addendum)
Rapid Response Event Note   Reason for Call : called to medical mall for pt with seizure activity being discharged.   Initial Focused Assessment: seizing in wheelchair. Was on her way out for discharge.      Interventions: Transferred over to stretcher, moved back to room 232, tele monitor put back on, VSS, ativan given, and pt intermittantly waking and speaking. Dr Arbutus Ped to bedside.   Plan of Care: as above, MRI and EEG ordered. Matt, RN to call for further assistance.    Event Summary: as above  MD Notified: Arbutus Ped 1450 to bedside covering for Dr Maylene Roes who was notified at 1440 Call Spry, RN

## 2022-06-23 NOTE — Progress Notes (Signed)
Chaplain responded to rapid response called. Chaplain offered support to pts mom and two children why medical team assisted pt. Pt family expressed anxiety and fear around situation, and worry for their mom/daughter. Chaplain stayed present till family could settle back in the room. Offered to follow up later this evening. Chaplain available for any further support.
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
## 2022-06-23 NOTE — Progress Notes (Signed)
Followed up with pt and family after page came through, pt was anxious about more tests being run. Pt expressed wanting help getting information on medications and making it more affordable as she is overwhelmed by knowing the costs. Pt expressed some emotional concerns and feelings she was overwhelmed with and we worked through some with talking it out. Pt was taken to CT. Chaplain will leave follow up for tomorrow.

## 2022-06-24 ENCOUNTER — Other Ambulatory Visit (HOSPITAL_COMMUNITY): Payer: Self-pay

## 2022-06-24 DIAGNOSIS — Z881 Allergy status to other antibiotic agents status: Secondary | ICD-10-CM | POA: Diagnosis not present

## 2022-06-24 DIAGNOSIS — Z882 Allergy status to sulfonamides status: Secondary | ICD-10-CM | POA: Diagnosis not present

## 2022-06-24 DIAGNOSIS — R55 Syncope and collapse: Secondary | ICD-10-CM | POA: Diagnosis not present

## 2022-06-24 DIAGNOSIS — I5041 Acute combined systolic (congestive) and diastolic (congestive) heart failure: Secondary | ICD-10-CM | POA: Diagnosis present

## 2022-06-24 DIAGNOSIS — R7989 Other specified abnormal findings of blood chemistry: Secondary | ICD-10-CM | POA: Diagnosis present

## 2022-06-24 DIAGNOSIS — I447 Left bundle-branch block, unspecified: Secondary | ICD-10-CM | POA: Diagnosis present

## 2022-06-24 DIAGNOSIS — I110 Hypertensive heart disease with heart failure: Secondary | ICD-10-CM | POA: Diagnosis present

## 2022-06-24 DIAGNOSIS — G40909 Epilepsy, unspecified, not intractable, without status epilepticus: Secondary | ICD-10-CM | POA: Diagnosis not present

## 2022-06-24 DIAGNOSIS — R001 Bradycardia, unspecified: Secondary | ICD-10-CM | POA: Diagnosis present

## 2022-06-24 DIAGNOSIS — K219 Gastro-esophageal reflux disease without esophagitis: Secondary | ICD-10-CM | POA: Diagnosis present

## 2022-06-24 DIAGNOSIS — Z888 Allergy status to other drugs, medicaments and biological substances status: Secondary | ICD-10-CM | POA: Diagnosis not present

## 2022-06-24 DIAGNOSIS — J4520 Mild intermittent asthma, uncomplicated: Secondary | ICD-10-CM | POA: Diagnosis present

## 2022-06-24 DIAGNOSIS — S20212A Contusion of left front wall of thorax, initial encounter: Secondary | ICD-10-CM | POA: Diagnosis present

## 2022-06-24 DIAGNOSIS — R079 Chest pain, unspecified: Secondary | ICD-10-CM | POA: Diagnosis not present

## 2022-06-24 DIAGNOSIS — R4689 Other symptoms and signs involving appearance and behavior: Secondary | ICD-10-CM

## 2022-06-24 DIAGNOSIS — Z8249 Family history of ischemic heart disease and other diseases of the circulatory system: Secondary | ICD-10-CM | POA: Diagnosis not present

## 2022-06-24 DIAGNOSIS — Z23 Encounter for immunization: Secondary | ICD-10-CM | POA: Diagnosis not present

## 2022-06-24 DIAGNOSIS — E039 Hypothyroidism, unspecified: Secondary | ICD-10-CM | POA: Diagnosis present

## 2022-06-24 DIAGNOSIS — G43909 Migraine, unspecified, not intractable, without status migrainosus: Secondary | ICD-10-CM | POA: Diagnosis present

## 2022-06-24 DIAGNOSIS — Z885 Allergy status to narcotic agent status: Secondary | ICD-10-CM | POA: Diagnosis not present

## 2022-06-24 DIAGNOSIS — Z87891 Personal history of nicotine dependence: Secondary | ICD-10-CM | POA: Diagnosis not present

## 2022-06-24 DIAGNOSIS — J302 Other seasonal allergic rhinitis: Secondary | ICD-10-CM | POA: Diagnosis present

## 2022-06-24 DIAGNOSIS — M810 Age-related osteoporosis without current pathological fracture: Secondary | ICD-10-CM | POA: Diagnosis present

## 2022-06-24 DIAGNOSIS — Y92410 Unspecified street and highway as the place of occurrence of the external cause: Secondary | ICD-10-CM | POA: Diagnosis not present

## 2022-06-24 DIAGNOSIS — F410 Panic disorder [episodic paroxysmal anxiety] without agoraphobia: Secondary | ICD-10-CM | POA: Diagnosis present

## 2022-06-24 LAB — MAGNESIUM: Magnesium: 2.2 mg/dL (ref 1.7–2.4)

## 2022-06-24 MED ORDER — HYDROCODONE-ACETAMINOPHEN 5-325 MG PO TABS: 1.0000 | ORAL_TABLET | Freq: Four times a day (QID) | ORAL | Status: DC | PRN

## 2022-06-24 NOTE — Plan of Care (Signed)
Neurology plan of care  MRI showed no significant abnl. EEG WNL. No indication for AEDs at this time. Patient may f/u with PCP. Neurology to sign off but please re-engage if additional neurologic concerns arise.  [name], MD [facility] [phone]  If 7pm- 7am, please page neurology on call as listed in [place].

## 2022-06-24 NOTE — Progress Notes (Signed)
Eeg done 
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
## 2022-06-24 NOTE — Procedures (Signed)
Routine EEG Report  Cassandra Zuniga is a 51 y.o. female with a history of spells who is undergoing an EEG to evaluate for seizures.  Report: This EEG was acquired with electrodes placed according to the International 10-20 electrode system (including Fp1, Fp2, F3, F4, C3, C4, P3, P4, O1, O2, T3, T4, T5, T6, A1, A2, Fz, Cz, Pz). The following electrodes were missing or displaced: none.  The occipital dominant rhythm was 9 Hz. This activity is reactive to stimulation. Drowsiness was manifested by background fragmentation; deeper stages of sleep were not identified. There was no focal slowing. There were no interictal epileptiform discharges. There were no electrographic seizures identified. There was no abnormal response to photic stimulation or hyperventilation.   Impression: This EEG was obtained while awake and drowsy and is normal.    Clinical Correlation: Normal EEGs, however, do not rule out epilepsy.  [name], MD [facility] [phone]  If 7pm- 7am, please page neurology on call as listed in [place].
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
## 2022-06-24 NOTE — TOC Benefit Eligibility Note (Signed)
Transition of Care [facility]) Benefit Eligibility Note    Patient Details  Name: Cassandra Zuniga MRN: [id number] Date of Birth: Sep 13, 1971   Medication/Dose: Delene Loll 24-$RemoveBefore'26mg'nCuijygxsflhX$  BID  Covered?: Yes  Tier: 3 Drug   Spoke with Person/Company/Phone Number:: Stacy with [employer] at [phone]  Co-Pay: If pharmacy in network: covered at 100% of the allowed cost as deductible met.  If pharmacy out of network: responsible for 50% co-insurance.  Prior Approval: No  Deductible: Met (Per rep, combined medical and pharmacy in-network deductible met as of time of call.)  Additional Notes: Call Reference: [id number]    Dannette Barbara Phone Number: [phone]  06/24/2022, 4:58 PM

## 2022-06-24 NOTE — Progress Notes (Signed)
CSW  initiated Benefits Check for Entresto 24-26 mg BID per [facility] consult request. Pending check results.   [place], [place]

## 2022-06-25 MED ORDER — ALPRAZOLAM 0.5 MG PO TABS
1.0000 mg | ORAL_TABLET | Freq: Once | ORAL | Status: AC
  Administered 2022-06-25: 1 mg via ORAL
  Filled 2022-06-25: qty 2

## 2022-06-25 MED ORDER — HYDROCODONE-ACETAMINOPHEN 5-325 MG PO TABS: 1.0000 | ORAL_TABLET | Freq: Four times a day (QID) | ORAL | 0 refills | Status: AC | PRN
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
## 2022-06-25 NOTE — Progress Notes (Signed)
  Transition of Care [facility]) Screening Note   Patient Details  Name: Cassandra Zuniga Date of Birth: Jan 23, 1971   Transition of Care [facility]) CM/SW Contact:    Alberteen Sam, LCSW Phone Number: 06/25/2022, 9:23 AM  Patient to discharge today, new entresto. Per benefits check if patient fills at in network pharmacy, will be covered 100%. No further dc needs.   Transition of Care Department [facility]) has reviewed patient and no TOC needs have been identified at this time. We will continue to monitor patient advancement through interdisciplinary progression rounds. If new patient transition needs arise, please place a TOC consult.  [place], [place]
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
## 2022-07-02 ENCOUNTER — Ambulatory Visit: Payer: BLUE CROSS/BLUE SHIELD | Admitting: Family

## 2022-08-15 ENCOUNTER — Encounter: Payer: Self-pay | Admitting: Emergency Medicine

## 2022-08-15 ENCOUNTER — Other Ambulatory Visit: Payer: Self-pay

## 2022-08-15 ENCOUNTER — Emergency Department: Payer: BLUE CROSS/BLUE SHIELD

## 2022-08-15 ENCOUNTER — Emergency Department
Admission: EM | Admit: 2022-08-15 | Discharge: 2022-08-15 | Disposition: A | Discharge status: Home / Self Care | Payer: BLUE CROSS/BLUE SHIELD | Attending: Student | Admitting: Student

## 2022-08-15 DIAGNOSIS — U071 COVID-19: Secondary | ICD-10-CM | POA: Insufficient documentation

## 2022-08-15 DIAGNOSIS — F411 Generalized anxiety disorder: Secondary | ICD-10-CM

## 2022-08-15 DIAGNOSIS — R509 Fever, unspecified: Secondary | ICD-10-CM | POA: Diagnosis present

## 2022-08-15 DIAGNOSIS — I5041 Acute combined systolic (congestive) and diastolic (congestive) heart failure: Secondary | ICD-10-CM | POA: Diagnosis not present

## 2022-08-15 DIAGNOSIS — R079 Chest pain, unspecified: Secondary | ICD-10-CM

## 2022-08-15 DIAGNOSIS — J45909 Unspecified asthma, uncomplicated: Secondary | ICD-10-CM | POA: Diagnosis not present

## 2022-08-15 DIAGNOSIS — E039 Hypothyroidism, unspecified: Secondary | ICD-10-CM | POA: Diagnosis not present

## 2022-08-15 DIAGNOSIS — F419 Anxiety disorder, unspecified: Secondary | ICD-10-CM | POA: Insufficient documentation

## 2022-08-15 LAB — CBC WITH DIFFERENTIAL/PLATELET
Abs Immature Granulocytes: 0.02 10*3/uL (ref 0.00–0.07)
Basophils Absolute: 0 10*3/uL (ref 0.0–0.1)
Basophils Relative: 0 %
Eosinophils Absolute: 0 10*3/uL (ref 0.0–0.5)
Eosinophils Relative: 0 %
HCT: 34.8 % — ABNORMAL LOW (ref 36.0–46.0)
Hemoglobin: 11.6 g/dL — ABNORMAL LOW (ref 12.0–15.0)
Immature Granulocytes: 0 %
Lymphocytes Relative: 5 %
Lymphs Abs: 0.3 10*3/uL — ABNORMAL LOW (ref 0.7–4.0)
MCH: 28.5 pg (ref 26.0–34.0)
MCHC: 33.3 g/dL (ref 30.0–36.0)
MCV: 85.5 fL (ref 80.0–100.0)
Monocytes Absolute: 0.7 10*3/uL (ref 0.1–1.0)
Monocytes Relative: 10 %
Neutro Abs: 5.6 10*3/uL (ref 1.7–7.7)
Neutrophils Relative %: 85 %
Platelets: 212 10*3/uL (ref 150–400)
RBC: 4.07 MIL/uL (ref 3.87–5.11)
RDW: 13.4 % (ref 11.5–15.5)
WBC: 6.6 10*3/uL (ref 4.0–10.5)
nRBC: 0 % (ref 0.0–0.2)

## 2022-08-15 LAB — BASIC METABOLIC PANEL
Anion gap: 10 (ref 5–15)
BUN: 8 mg/dL (ref 6–20)
CO2: 22 mmol/L (ref 22–32)
Calcium: 9.4 mg/dL (ref 8.9–10.3)
Chloride: 107 mmol/L (ref 98–111)
Creatinine, Ser: 0.72 mg/dL (ref 0.44–1.00)
GFR, Estimated: >60 mL/min (ref >60)
Glucose, Bld: 110 mg/dL — ABNORMAL HIGH (ref 70–99)
Potassium: 3.6 mmol/L (ref 3.5–5.1)
Sodium: 139 mmol/L (ref 135–145)

## 2022-08-15 LAB — HEPATIC FUNCTION PANEL
ALT: 17 U/L (ref 0–44)
AST: 23 U/L (ref 15–41)
Albumin: 4.7 g/dL (ref 3.5–5.0)
Alkaline Phosphatase: 69 U/L (ref 38–126)
Bilirubin, Direct: 0.2 mg/dL (ref 0.0–0.2)
Indirect Bilirubin: 0.7 mg/dL (ref 0.3–0.9)
Total Bilirubin: 0.9 mg/dL (ref 0.3–1.2)
Total Protein: 8.1 g/dL (ref 6.5–8.1)

## 2022-08-15 LAB — LIPASE, BLOOD: Lipase: 34 U/L (ref 11–51)

## 2022-08-15 LAB — RESP PANEL BY RT-PCR (RSV, FLU A&B, COVID)  RVPGX2
Influenza A by PCR: NEGATIVE
Influenza B by PCR: NEGATIVE
Resp Syncytial Virus by PCR: NEGATIVE
SARS Coronavirus 2 by RT PCR: POSITIVE — AB

## 2022-08-15 LAB — TROPONIN I (HIGH SENSITIVITY)
Troponin I (High Sensitivity): 4 ng/L (ref <18)
Troponin I (High Sensitivity): 5 ng/L (ref <18)

## 2022-08-15 MED ORDER — LORAZEPAM 1 MG PO TABS: 1.0000 mg | ORAL_TABLET | Freq: Once | ORAL | Status: DC

## 2022-08-15 MED ORDER — ACETAMINOPHEN 325 MG PO TABS
650.0000 mg | ORAL_TABLET | Freq: Once | ORAL | Status: AC
  Administered 2022-08-15: 650 mg via ORAL
  Filled 2022-08-15: qty 2

## 2022-08-15 MED ORDER — ONDANSETRON HCL 4 MG PO TABS
4.0000 mg | ORAL_TABLET | Freq: Once | ORAL | Status: AC
  Administered 2022-08-15: 4 mg via ORAL

## 2022-08-15 MED ORDER — LORAZEPAM 1 MG PO TABS
1.0000 mg | ORAL_TABLET | Freq: Once | ORAL | Status: AC
  Administered 2022-08-15: 1 mg via ORAL
  Filled 2022-08-15: qty 1

## 2022-08-15 MED ORDER — HYDROXYZINE HCL 10 MG PO TABS: 10.0000 mg | ORAL_TABLET | Freq: Every evening | ORAL | 0 refills | Status: AC | PRN
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
## 2022-08-15 NOTE — ED Triage Notes (Signed)
Patient brought in by mother stating patient has been having chest pain and seizure like activity since patient found out her son was incarcerated today. Patient reports hx of seizures however not on any medicine. Patient is alert and talking but very shaky. Patients mother reports pt was scheduled to have gallbladder removed today.

## 2022-08-15 NOTE — ED Notes (Signed)
Provider asked by RN to come speak with/evaluate pt. Pt now c/o n/v and renewed chest pain. Provider placed orders for EKG to be repeated.

## 2022-08-15 NOTE — Discharge Instructions (Addendum)
You were found to be positive for COVID. Your heart tests were normal. You may take the anxiety medication as prescribed. Return for any new, worsening, or change in symptoms or other concerns.

## 2023-09-04 IMAGING — DX DG ANKLE COMPLETE 3+V*R*
3 series · 3 of 3 positions shown · non-contrast
Comparison: Tibia and fibula from 7272.

CLINICAL DATA: A 50-year-old female presents with RIGHT ankle pain
for 2 weeks, no history of injury by report.

EXAM:
RIGHT ANKLE - COMPLETE 3+ VIEW

[ankle ap]
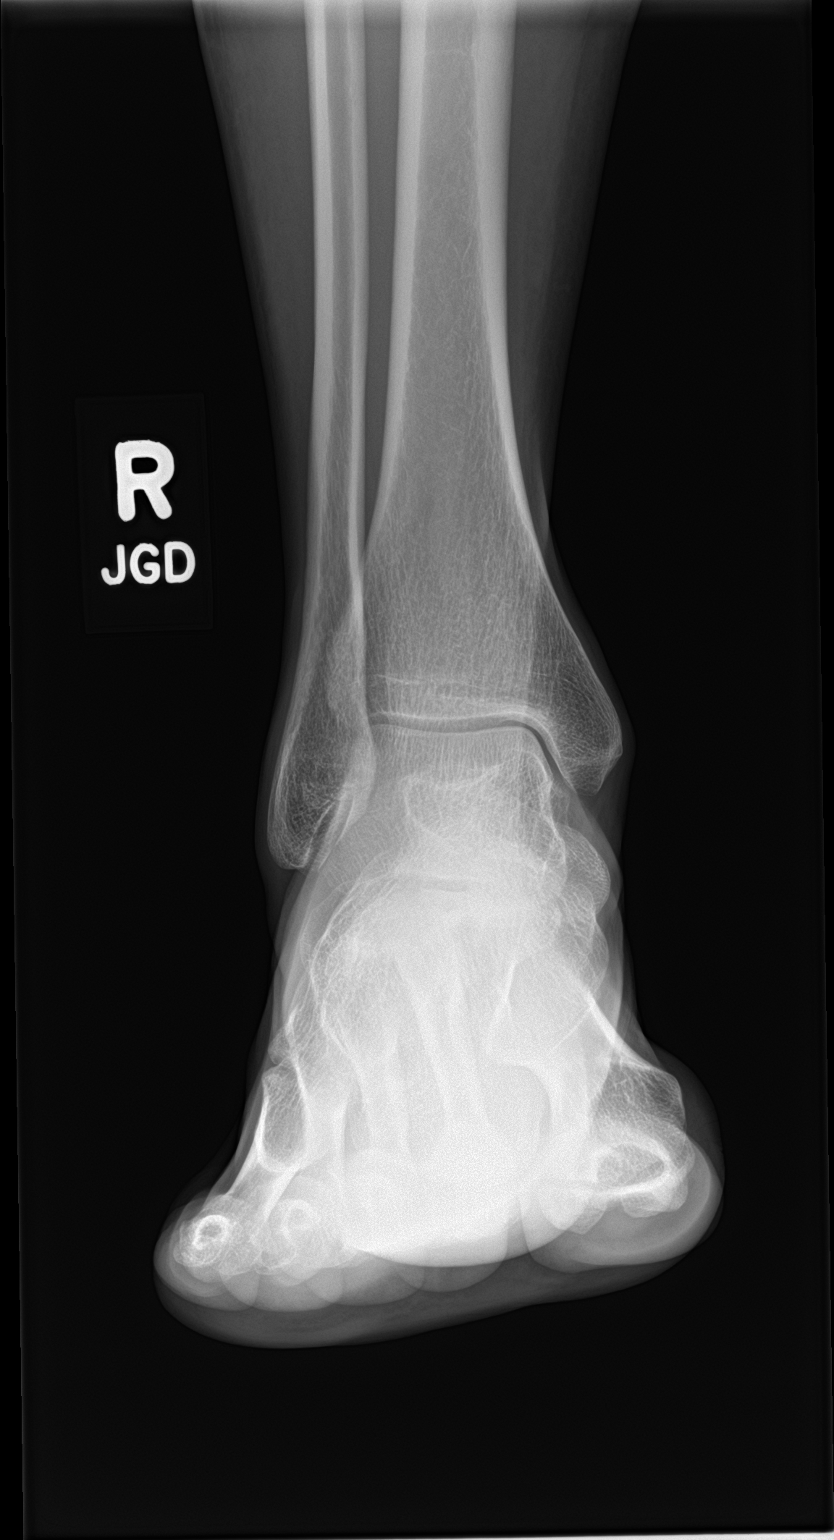

[ankle obl]
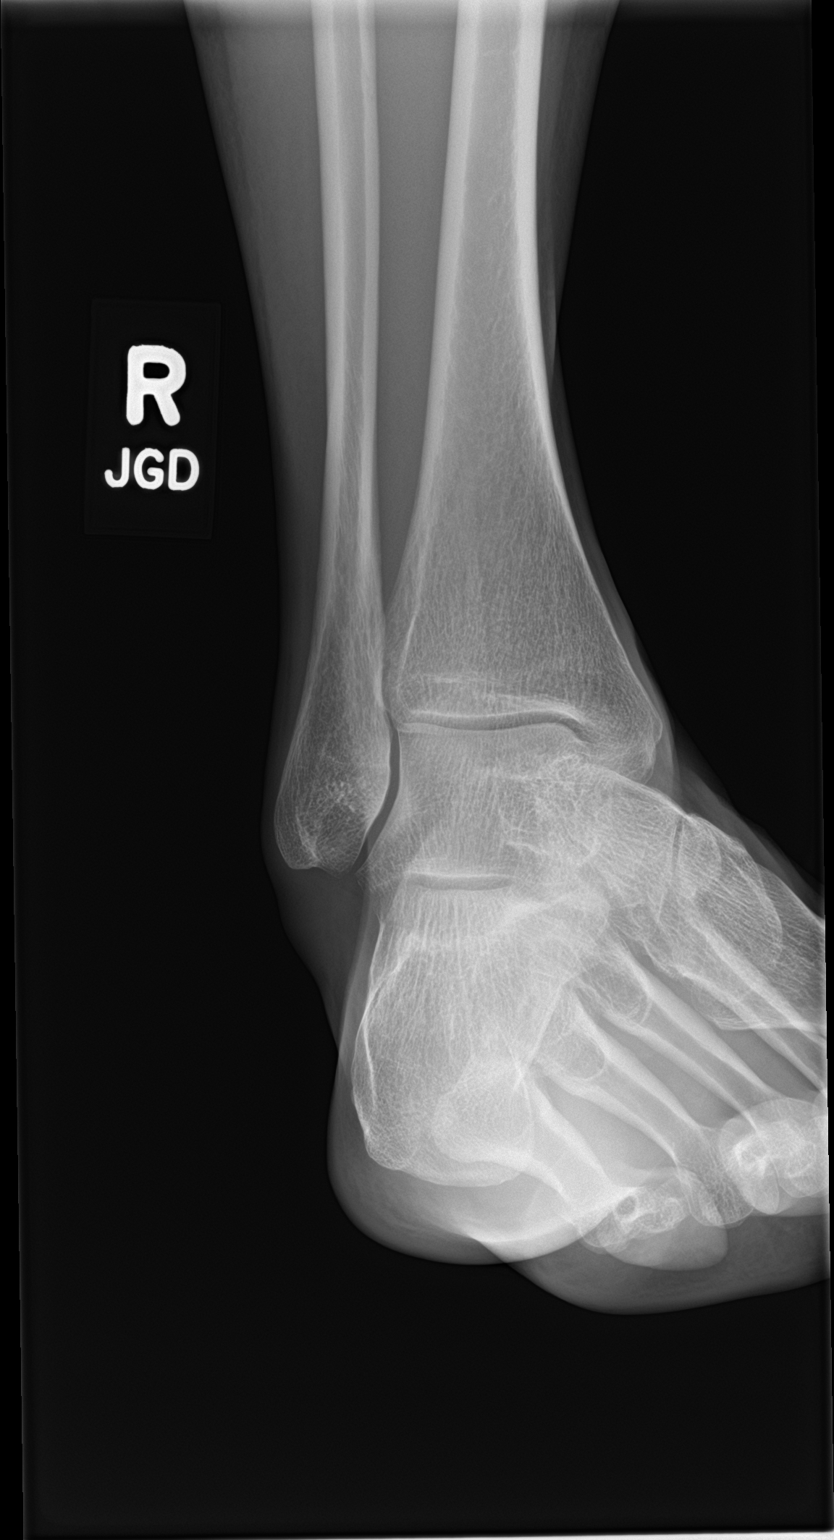

[ankle lat]
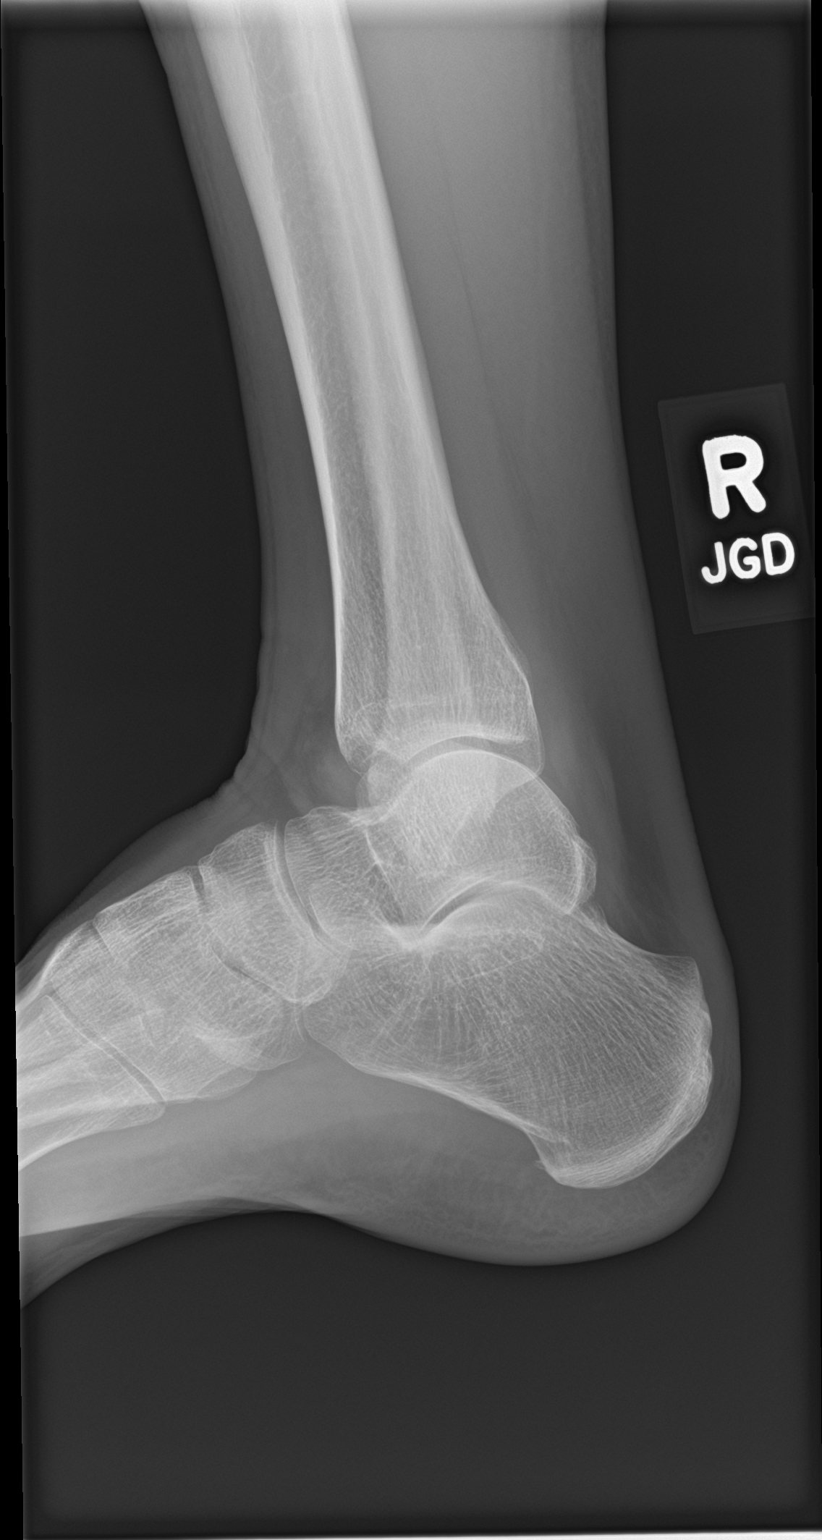

[3 of 3 positions shown; findings below may reference images not displayed]

FINDINGS: There is no evidence of fracture, dislocation, or joint effusion.
There is no evidence of arthropathy or other focal bone abnormality.
Soft tissues are unremarkable.
IMPRESSION: Negative.
